# Patient Record
Sex: Female | Born: 1975 | Race: Black or African American | Hispanic: No | Marital: Single | Smoking: Never smoker
Health system: Southern US, Community
[De-identification: ages and names within clinical notes are randomized; demographics above are authoritative.]

## PROBLEM LIST (undated history)

## (undated) DIAGNOSIS — I10 Essential (primary) hypertension: Secondary | ICD-10-CM

## (undated) DIAGNOSIS — D649 Anemia, unspecified: Secondary | ICD-10-CM

## (undated) DIAGNOSIS — D219 Benign neoplasm of connective and other soft tissue, unspecified: Secondary | ICD-10-CM

## (undated) DIAGNOSIS — J302 Other seasonal allergic rhinitis: Secondary | ICD-10-CM

## (undated) DIAGNOSIS — F819 Developmental disorder of scholastic skills, unspecified: Secondary | ICD-10-CM

## (undated) HISTORY — DX: Benign neoplasm of connective and other soft tissue, unspecified: D21.9

## (undated) HISTORY — DX: Other seasonal allergic rhinitis: J30.2

## (undated) HISTORY — DX: Essential (primary) hypertension: I10

## (undated) HISTORY — DX: Anemia, unspecified: D64.9

## (undated) HISTORY — DX: Developmental disorder of scholastic skills, unspecified: F81.9

---

## 1999-09-20 ENCOUNTER — Ambulatory Visit (HOSPITAL_COMMUNITY): Admission: RE | Admit: 1999-09-20 | Discharge: 1999-09-20 | Payer: Self-pay | Admitting: Obstetrics and Gynecology

## 2005-01-09 ENCOUNTER — Ambulatory Visit: Payer: Self-pay | Admitting: Internal Medicine

## 2006-01-19 ENCOUNTER — Ambulatory Visit: Payer: Self-pay | Admitting: Internal Medicine

## 2006-02-14 ENCOUNTER — Ambulatory Visit: Payer: Self-pay | Admitting: Internal Medicine

## 2006-02-15 ENCOUNTER — Ambulatory Visit: Payer: Self-pay | Admitting: Internal Medicine

## 2006-03-12 ENCOUNTER — Ambulatory Visit: Payer: Self-pay

## 2006-03-12 ENCOUNTER — Encounter: Payer: Self-pay | Admitting: Cardiology

## 2006-08-13 ENCOUNTER — Emergency Department (HOSPITAL_COMMUNITY): Admission: EM | Admit: 2006-08-13 | Discharge: 2006-08-13 | Payer: Self-pay | Admitting: Emergency Medicine

## 2006-08-13 ENCOUNTER — Ambulatory Visit: Payer: Self-pay | Admitting: Internal Medicine

## 2007-01-02 ENCOUNTER — Ambulatory Visit: Payer: Self-pay | Admitting: Internal Medicine

## 2007-04-09 ENCOUNTER — Telehealth: Payer: Self-pay | Admitting: Internal Medicine

## 2007-04-15 ENCOUNTER — Telehealth: Payer: Self-pay | Admitting: Internal Medicine

## 2007-05-10 ENCOUNTER — Ambulatory Visit: Payer: Self-pay | Admitting: Internal Medicine

## 2007-05-10 DIAGNOSIS — D509 Iron deficiency anemia, unspecified: Secondary | ICD-10-CM | POA: Insufficient documentation

## 2007-05-10 DIAGNOSIS — F8189 Other developmental disorders of scholastic skills: Secondary | ICD-10-CM

## 2007-05-14 ENCOUNTER — Telehealth: Payer: Self-pay | Admitting: Internal Medicine

## 2007-05-28 LAB — CONVERTED CEMR LAB
Folate: >20 ng/mL
Iron: <10 ug/dL — ABNORMAL LOW (ref 42–165)
UIBC: 369 ug/dL
Vitamin B-12: 558 pg/mL (ref 211–911)

## 2007-06-14 ENCOUNTER — Ambulatory Visit: Payer: Self-pay | Admitting: Internal Medicine

## 2007-06-18 LAB — CONVERTED CEMR LAB
Basophils Absolute: 0 10*3/uL (ref 0.0–0.1)
Basophils Relative: 0 % (ref 0–1)
Eosinophils Absolute: 0.1 10*3/uL (ref 0.0–0.7)
Eosinophils Relative: 2 % (ref 0–5)
Lymphocytes Relative: 42 % (ref 12–46)
Lymphs Abs: 2.3 10*3/uL (ref 0.7–3.3)
MCHC: 29 g/dL — ABNORMAL LOW (ref 30.0–36.0)
Monocytes Relative: 9 % (ref 3–11)
Neutro Abs: 2.6 10*3/uL (ref 1.7–7.7)
Neutrophils Relative %: 47 % (ref 43–77)
RBC: 3.9 M/uL — ABNORMAL LOW (ref 4.22–5.81)
WBC: 5.6 10*3/uL (ref 4.0–10.5)

## 2007-06-21 ENCOUNTER — Ambulatory Visit: Payer: Self-pay | Admitting: Oncology

## 2007-07-15 LAB — CHCC SMEAR

## 2007-07-15 LAB — CBC WITH DIFFERENTIAL/PLATELET
BASO%: 0 % (ref 0.0–2.0)
Basophils Absolute: 0 10*3/uL (ref 0.0–0.1)
EOS%: 2.1 % (ref 0.0–7.0)
Eosinophils Absolute: 0.1 10*3/uL (ref 0.0–0.5)
HCT: 23.6 % — ABNORMAL LOW (ref 34.8–46.6)
HGB: 7.1 g/dL — ABNORMAL LOW (ref 11.6–15.9)
LYMPH%: 38.5 % (ref 14.0–48.0)
MCH: 19.2 pg — ABNORMAL LOW (ref 26.0–34.0)
MCHC: 30.2 g/dL — ABNORMAL LOW (ref 32.0–36.0)
MCV: 63.5 fL — ABNORMAL LOW (ref 81.0–101.0)
MONO#: 0.4 10*3/uL (ref 0.1–0.9)
MONO%: 7.7 % (ref 0.0–13.0)
NEUT#: 2.5 10*3/uL (ref 1.5–6.5)
NEUT%: 51.7 % (ref 39.6–76.8)
Platelets: 397 10*3/uL (ref 145–400)
RBC: 3.72 10*6/uL (ref 3.70–5.32)
RDW: 19.4 % — ABNORMAL HIGH (ref 11.3–14.5)
WBC: 4.9 10*3/uL (ref 3.9–10.0)
lymph#: 1.9 10*3/uL (ref 0.9–3.3)

## 2007-07-17 LAB — COMPREHENSIVE METABOLIC PANEL
ALT: <8 U/L (ref 0–35)
Alkaline Phosphatase: 40 U/L (ref 39–117)
CO2: 24 mEq/L (ref 19–32)
Creatinine, Ser: 0.77 mg/dL (ref 0.40–1.20)
Sodium: 141 mEq/L (ref 135–145)
Total Bilirubin: 0.4 mg/dL (ref 0.3–1.2)
Total Protein: 7.1 g/dL (ref 6.0–8.3)

## 2007-07-17 LAB — FERRITIN: Ferritin: 4 ng/mL — ABNORMAL LOW (ref 10–291)

## 2007-07-17 LAB — IRON AND TIBC
Iron: 13 ug/dL — ABNORMAL LOW (ref 42–145)
TIBC: 443 ug/dL (ref 250–470)
UIBC: 430 ug/dL

## 2007-07-17 LAB — LACTATE DEHYDROGENASE: LDH: 229 U/L (ref 94–250)

## 2007-08-06 ENCOUNTER — Encounter: Payer: Self-pay | Admitting: Internal Medicine

## 2007-08-16 ENCOUNTER — Ambulatory Visit: Payer: Self-pay | Admitting: Oncology

## 2007-08-20 ENCOUNTER — Encounter: Payer: Self-pay | Admitting: Internal Medicine

## 2007-08-20 LAB — COMPREHENSIVE METABOLIC PANEL
ALT: 10 U/L (ref 0–35)
AST: 14 U/L (ref 0–37)
Albumin: 4.3 g/dL (ref 3.5–5.2)
Alkaline Phosphatase: 46 U/L (ref 39–117)
Potassium: 4.2 mEq/L (ref 3.5–5.3)
Sodium: 142 mEq/L (ref 135–145)
Total Bilirubin: 0.4 mg/dL (ref 0.3–1.2)
Total Protein: 7.3 g/dL (ref 6.0–8.3)

## 2007-08-20 LAB — IRON AND TIBC: %SAT: 17 % — ABNORMAL LOW (ref 20–55)

## 2007-08-20 LAB — CBC WITH DIFFERENTIAL/PLATELET
BASO%: 0.9 % (ref 0.0–2.0)
LYMPH%: 32.6 % (ref 14.0–48.0)
MCHC: 29 g/dL — ABNORMAL LOW (ref 32.0–36.0)
MCV: 76.6 fL — ABNORMAL LOW (ref 81.0–101.0)
MONO%: 8.9 % (ref 0.0–13.0)
NEUT#: 2.2 10*3/uL (ref 1.5–6.5)
Platelets: 312 10*3/uL (ref 145–400)
RBC: 4.55 10*6/uL (ref 3.70–5.32)
RDW: 25 % — ABNORMAL HIGH (ref 11.3–14.5)
WBC: 4 10*3/uL (ref 3.9–10.0)

## 2007-09-11 ENCOUNTER — Telehealth (INDEPENDENT_AMBULATORY_CARE_PROVIDER_SITE_OTHER): Payer: Self-pay | Admitting: *Deleted

## 2007-09-23 ENCOUNTER — Telehealth (INDEPENDENT_AMBULATORY_CARE_PROVIDER_SITE_OTHER): Payer: Self-pay | Admitting: *Deleted

## 2007-10-03 ENCOUNTER — Telehealth (INDEPENDENT_AMBULATORY_CARE_PROVIDER_SITE_OTHER): Payer: Self-pay | Admitting: *Deleted

## 2007-10-03 ENCOUNTER — Emergency Department (HOSPITAL_COMMUNITY): Admission: EM | Admit: 2007-10-03 | Discharge: 2007-10-03 | Payer: Self-pay | Admitting: Emergency Medicine

## 2007-10-14 ENCOUNTER — Telehealth (INDEPENDENT_AMBULATORY_CARE_PROVIDER_SITE_OTHER): Payer: Self-pay | Admitting: *Deleted

## 2007-10-16 ENCOUNTER — Ambulatory Visit: Payer: Self-pay | Admitting: Oncology

## 2007-10-18 ENCOUNTER — Ambulatory Visit: Payer: Self-pay | Admitting: Internal Medicine

## 2007-10-18 DIAGNOSIS — D259 Leiomyoma of uterus, unspecified: Secondary | ICD-10-CM | POA: Insufficient documentation

## 2007-10-21 ENCOUNTER — Encounter: Payer: Self-pay | Admitting: Internal Medicine

## 2007-10-21 LAB — CBC WITH DIFFERENTIAL/PLATELET
Basophils Absolute: 0 10*3/uL (ref 0.0–0.1)
Eosinophils Absolute: 0.2 10*3/uL (ref 0.0–0.5)
HCT: 33.8 % — ABNORMAL LOW (ref 34.8–46.6)
LYMPH%: 37.2 % (ref 14.0–48.0)
MCV: 81.1 fL (ref 81.0–101.0)
MONO%: 7.3 % (ref 0.0–13.0)
RBC: 4.17 10*6/uL (ref 3.70–5.32)
WBC: 4.3 10*3/uL (ref 3.9–10.0)

## 2007-10-21 LAB — IRON AND TIBC
%SAT: 23 % (ref 20–55)
Iron: 68 ug/dL (ref 42–145)
UIBC: 226 ug/dL

## 2007-10-21 LAB — FERRITIN: Ferritin: 69 ng/mL (ref 10–291)

## 2007-10-21 LAB — COMPREHENSIVE METABOLIC PANEL
Alkaline Phosphatase: 41 U/L (ref 39–117)
BUN: 11 mg/dL (ref 6–23)
Creatinine, Ser: 1.08 mg/dL (ref 0.40–1.20)
Glucose, Bld: 92 mg/dL (ref 70–99)
Total Bilirubin: 0.3 mg/dL (ref 0.3–1.2)

## 2007-12-17 ENCOUNTER — Ambulatory Visit: Payer: Self-pay | Admitting: Oncology

## 2007-12-19 ENCOUNTER — Encounter: Payer: Self-pay | Admitting: Internal Medicine

## 2007-12-19 LAB — CBC WITH DIFFERENTIAL/PLATELET
Basophils Absolute: 0 10*3/uL (ref 0.0–0.1)
Eosinophils Absolute: 0.2 10*3/uL (ref 0.0–0.5)
HCT: 35.4 % (ref 34.8–46.6)
HGB: 11.7 g/dL (ref 11.6–15.9)
LYMPH%: 33.4 % (ref 14.0–48.0)
MCV: 79.3 fL — ABNORMAL LOW (ref 81.0–101.0)
MONO%: 8.1 % (ref 0.0–13.0)
NEUT#: 3.3 10*3/uL (ref 1.5–6.5)
NEUT%: 54.3 % (ref 39.6–76.8)
Platelets: 332 10*3/uL (ref 145–400)

## 2007-12-19 LAB — FERRITIN: Ferritin: 40 ng/mL (ref 10–291)

## 2007-12-19 LAB — COMPREHENSIVE METABOLIC PANEL
Alkaline Phosphatase: 46 U/L (ref 39–117)
BUN: 11 mg/dL (ref 6–23)
CO2: 24 mEq/L (ref 19–32)
Glucose, Bld: 96 mg/dL (ref 70–99)
Total Bilirubin: 0.3 mg/dL (ref 0.3–1.2)

## 2007-12-19 LAB — IRON AND TIBC
%SAT: 20 % (ref 20–55)
Iron: 64 ug/dL (ref 42–145)
UIBC: 255 ug/dL

## 2007-12-19 LAB — LACTATE DEHYDROGENASE: LDH: 212 U/L (ref 94–250)

## 2008-02-18 ENCOUNTER — Ambulatory Visit: Payer: Self-pay | Admitting: Oncology

## 2008-02-24 ENCOUNTER — Encounter: Payer: Self-pay | Admitting: Internal Medicine

## 2008-02-24 LAB — COMPREHENSIVE METABOLIC PANEL
Albumin: 4.3 g/dL (ref 3.5–5.2)
CO2: 23 mEq/L (ref 19–32)
Calcium: 9 mg/dL (ref 8.4–10.5)
Glucose, Bld: 85 mg/dL (ref 70–99)
Sodium: 141 mEq/L (ref 135–145)
Total Bilirubin: 0.2 mg/dL — ABNORMAL LOW (ref 0.3–1.2)
Total Protein: 7.3 g/dL (ref 6.0–8.3)

## 2008-02-24 LAB — CBC WITH DIFFERENTIAL/PLATELET
Eosinophils Absolute: 0.2 10*3/uL (ref 0.0–0.5)
HCT: 33.6 % — ABNORMAL LOW (ref 34.8–46.6)
LYMPH%: 32.1 % (ref 14.0–48.0)
MONO#: 0.4 10*3/uL (ref 0.1–0.9)
NEUT#: 2.9 10*3/uL (ref 1.5–6.5)
Platelets: 319 10*3/uL (ref 145–400)
RBC: 4.2 10*6/uL (ref 3.70–5.32)
WBC: 5.1 10*3/uL (ref 3.9–10.0)

## 2008-02-24 LAB — IRON AND TIBC
%SAT: 11 % — ABNORMAL LOW (ref 20–55)
Iron: 37 ug/dL — ABNORMAL LOW (ref 42–145)

## 2008-02-24 LAB — LACTATE DEHYDROGENASE: LDH: 236 U/L (ref 94–250)

## 2008-04-06 ENCOUNTER — Telehealth: Payer: Self-pay | Admitting: Internal Medicine

## 2008-04-08 ENCOUNTER — Ambulatory Visit: Payer: Self-pay | Admitting: Internal Medicine

## 2008-04-08 DIAGNOSIS — E669 Obesity, unspecified: Secondary | ICD-10-CM | POA: Insufficient documentation

## 2008-04-08 DIAGNOSIS — Q210 Ventricular septal defect: Secondary | ICD-10-CM

## 2008-04-22 ENCOUNTER — Ambulatory Visit: Payer: Self-pay | Admitting: Oncology

## 2008-04-24 ENCOUNTER — Encounter: Payer: Self-pay | Admitting: Internal Medicine

## 2008-04-24 LAB — CBC WITH DIFFERENTIAL/PLATELET
Basophils Absolute: 0 10*3/uL (ref 0.0–0.1)
Eosinophils Absolute: 0.1 10*3/uL (ref 0.0–0.5)
HGB: 12.1 g/dL (ref 11.6–15.9)
MCV: 82 fL (ref 81.0–101.0)
MONO#: 0.4 10*3/uL (ref 0.1–0.9)
NEUT#: 2.7 10*3/uL (ref 1.5–6.5)
RDW: 16.7 % — ABNORMAL HIGH (ref 11.3–14.5)
WBC: 5.2 10*3/uL (ref 3.9–10.0)
lymph#: 2 10*3/uL (ref 0.9–3.3)

## 2008-04-24 LAB — COMPREHENSIVE METABOLIC PANEL
Albumin: 4.4 g/dL (ref 3.5–5.2)
BUN: 12 mg/dL (ref 6–23)
CO2: 24 mEq/L (ref 19–32)
Calcium: 9.8 mg/dL (ref 8.4–10.5)
Chloride: 104 mEq/L (ref 96–112)
Glucose, Bld: 79 mg/dL (ref 70–99)
Potassium: 4.1 mEq/L (ref 3.5–5.3)
Sodium: 136 mEq/L (ref 135–145)
Total Protein: 7.4 g/dL (ref 6.0–8.3)

## 2008-04-24 LAB — FERRITIN: Ferritin: 16 ng/mL (ref 10–291)

## 2008-04-24 LAB — IRON AND TIBC: Iron: 39 ug/dL — ABNORMAL LOW (ref 42–145)

## 2008-06-18 ENCOUNTER — Ambulatory Visit: Payer: Self-pay | Admitting: Oncology

## 2008-06-22 ENCOUNTER — Encounter: Payer: Self-pay | Admitting: Internal Medicine

## 2008-06-22 LAB — CBC WITH DIFFERENTIAL/PLATELET
BASO%: 0.6 % (ref 0.0–2.0)
EOS%: 1.8 % (ref 0.0–7.0)
Eosinophils Absolute: 0.1 10*3/uL (ref 0.0–0.5)
HCT: 34.6 % — ABNORMAL LOW (ref 34.8–46.6)
HGB: 11.3 g/dL — ABNORMAL LOW (ref 11.6–15.9)
MONO#: 0.4 10*3/uL (ref 0.1–0.9)
MONO%: 9.7 % (ref 0.0–13.0)
Platelets: 352 10*3/uL (ref 145–400)
WBC: 4.6 10*3/uL (ref 3.9–10.0)
lymph#: 1.7 10*3/uL (ref 0.9–3.3)

## 2008-06-26 LAB — COMPREHENSIVE METABOLIC PANEL
ALT: 10 U/L (ref 0–35)
Albumin: 4.2 g/dL (ref 3.5–5.2)
BUN: 10 mg/dL (ref 6–23)
CO2: 26 mEq/L (ref 19–32)
Calcium: 8.8 mg/dL (ref 8.4–10.5)
Chloride: 104 mEq/L (ref 96–112)
Creatinine, Ser: 0.65 mg/dL (ref 0.40–1.20)
Potassium: 4.1 mEq/L (ref 3.5–5.3)

## 2008-06-26 LAB — VON WILLEBRAND PANEL: Ristocetin-Cofactor: 64 % (ref 50–150)

## 2008-06-26 LAB — FERRITIN: Ferritin: 15 ng/mL (ref 10–291)

## 2008-06-26 LAB — IRON AND TIBC: TIBC: 334 ug/dL (ref 250–470)

## 2008-06-26 LAB — LACTATE DEHYDROGENASE: LDH: 230 U/L (ref 94–250)

## 2008-08-18 ENCOUNTER — Ambulatory Visit: Payer: Self-pay | Admitting: Oncology

## 2008-08-24 ENCOUNTER — Encounter: Payer: Self-pay | Admitting: Internal Medicine

## 2008-08-24 LAB — CBC WITH DIFFERENTIAL/PLATELET
HGB: 10.6 g/dL — ABNORMAL LOW (ref 11.6–15.9)
LYMPH%: 30 % (ref 14.0–48.0)
MCHC: 32.4 g/dL (ref 32.0–36.0)
NEUT#: 3.2 10*3/uL (ref 1.5–6.5)
NEUT%: 59.5 % (ref 39.6–76.8)
Platelets: 269 10*3/uL (ref 145–400)
RBC: 4.08 10*6/uL (ref 3.70–5.32)
RDW: 17.8 % — ABNORMAL HIGH (ref 11.3–14.5)
WBC: 5.4 10*3/uL (ref 3.9–10.0)

## 2008-08-26 LAB — COMPREHENSIVE METABOLIC PANEL
ALT: 10 U/L (ref 0–35)
AST: 14 U/L (ref 0–37)
Albumin: 4 g/dL (ref 3.5–5.2)
BUN: 9 mg/dL (ref 6–23)
CO2: 21 mEq/L (ref 19–32)
Calcium: 8.9 mg/dL (ref 8.4–10.5)
Chloride: 108 mEq/L (ref 96–112)
Creatinine, Ser: 0.66 mg/dL (ref 0.40–1.20)
Potassium: 4.1 mEq/L (ref 3.5–5.3)

## 2008-08-26 LAB — IRON AND TIBC: UIBC: 322 ug/dL

## 2008-08-26 LAB — LACTATE DEHYDROGENASE: LDH: 218 U/L (ref 94–250)

## 2008-08-26 LAB — TRANSFERRIN RECEPTOR, SOLUABLE: Transferrin Receptor, Soluble: 42.6 nmol/L

## 2008-08-28 HISTORY — PX: MYOMECTOMY: SHX85

## 2008-10-21 ENCOUNTER — Ambulatory Visit: Payer: Self-pay | Admitting: Oncology

## 2008-10-23 ENCOUNTER — Encounter: Payer: Self-pay | Admitting: Internal Medicine

## 2008-10-23 LAB — CBC WITH DIFFERENTIAL/PLATELET
BASO%: 0.4 % (ref 0.0–2.0)
Basophils Absolute: 0 10*3/uL (ref 0.0–0.1)
HGB: 10.5 g/dL — ABNORMAL LOW (ref 11.6–15.9)
MCH: 27.2 pg (ref 25.1–34.0)
NEUT#: 2.9 10*3/uL (ref 1.5–6.5)
NEUT%: 60.1 % (ref 38.4–76.8)
Platelets: 268 10*3/uL (ref 145–400)
RBC: 3.85 10*6/uL (ref 3.70–5.45)
RDW: 18.6 % — ABNORMAL HIGH (ref 11.2–14.5)
lymph#: 1.4 10*3/uL (ref 0.9–3.3)

## 2008-10-23 LAB — COMPREHENSIVE METABOLIC PANEL
AST: 23 U/L (ref 0–37)
Alkaline Phosphatase: 39 U/L (ref 39–117)
BUN: 8 mg/dL (ref 6–23)
Creatinine, Ser: 0.73 mg/dL (ref 0.40–1.20)
Glucose, Bld: 113 mg/dL — ABNORMAL HIGH (ref 70–99)
Potassium: 4 mEq/L (ref 3.5–5.3)

## 2008-10-23 LAB — LACTATE DEHYDROGENASE: LDH: 210 U/L (ref 94–250)

## 2008-10-25 LAB — IRON AND TIBC
%SAT: 15 % — ABNORMAL LOW (ref 20–55)
Iron: 38 ug/dL — ABNORMAL LOW (ref 42–145)
TIBC: 262 ug/dL (ref 250–470)

## 2008-10-25 LAB — TRANSFERRIN RECEPTOR, SOLUABLE: Transferrin Receptor, Soluble: 39.3 nmol/L

## 2008-12-05 ENCOUNTER — Encounter: Payer: Self-pay | Admitting: Internal Medicine

## 2009-01-05 ENCOUNTER — Ambulatory Visit: Payer: Self-pay | Admitting: Oncology

## 2009-01-07 ENCOUNTER — Encounter: Payer: Self-pay | Admitting: Internal Medicine

## 2009-01-07 LAB — COMPREHENSIVE METABOLIC PANEL
Albumin: 3.7 g/dL (ref 3.5–5.2)
CO2: 26 mEq/L (ref 19–32)
Calcium: 8.9 mg/dL (ref 8.4–10.5)
Chloride: 107 mEq/L (ref 96–112)
Glucose, Bld: 114 mg/dL — ABNORMAL HIGH (ref 70–99)
Potassium: 3.7 mEq/L (ref 3.5–5.3)
Total Bilirubin: 0.5 mg/dL (ref 0.3–1.2)
Total Protein: 6.7 g/dL (ref 6.0–8.3)

## 2009-01-07 LAB — CBC WITH DIFFERENTIAL/PLATELET
HCT: 28 % — ABNORMAL LOW (ref 34.8–46.6)
LYMPH%: 32.8 % (ref 14.0–49.7)
MCH: 26.4 pg (ref 25.1–34.0)
MONO#: 0.4 10*3/uL (ref 0.1–0.9)
NEUT#: 3 10*3/uL (ref 1.5–6.5)
NEUT%: 57.5 % (ref 38.4–76.8)
Platelets: 297 10*3/uL (ref 145–400)
WBC: 5.2 10*3/uL (ref 3.9–10.3)

## 2009-01-07 LAB — LACTATE DEHYDROGENASE: LDH: 140 U/L (ref 94–250)

## 2009-01-11 LAB — IRON AND TIBC
Iron: 24 ug/dL — ABNORMAL LOW (ref 42–145)
TIBC: 303 ug/dL (ref 250–470)

## 2009-01-11 LAB — TRANSFERRIN RECEPTOR, SOLUABLE: Transferrin Receptor, Soluble: 45.9 nmol/L

## 2009-02-22 ENCOUNTER — Encounter: Payer: Self-pay | Admitting: Internal Medicine

## 2009-02-22 ENCOUNTER — Ambulatory Visit: Payer: Self-pay | Admitting: Oncology

## 2009-02-22 LAB — CBC WITH DIFFERENTIAL/PLATELET
BASO%: 0.6 % (ref 0.0–2.0)
Basophils Absolute: 0 10*3/uL (ref 0.0–0.1)
EOS%: 2.3 % (ref 0.0–7.0)
Eosinophils Absolute: 0.1 10*3/uL (ref 0.0–0.5)
HCT: 33.6 % — ABNORMAL LOW (ref 34.8–46.6)
HGB: 11.1 g/dL — ABNORMAL LOW (ref 11.6–15.9)
MCH: 26 pg (ref 25.1–34.0)
MCHC: 33 g/dL (ref 31.5–36.0)
Platelets: 305 10*3/uL (ref 145–400)
RBC: 4.26 10*6/uL (ref 3.70–5.45)
WBC: 5 10*3/uL (ref 3.9–10.3)

## 2009-02-22 LAB — FERRITIN: Ferritin: 20 ng/mL (ref 10–291)

## 2009-02-22 LAB — IRON AND TIBC: TIBC: 340 ug/dL (ref 250–470)

## 2010-06-13 ENCOUNTER — Emergency Department (HOSPITAL_BASED_OUTPATIENT_CLINIC_OR_DEPARTMENT_OTHER): Admission: EM | Admit: 2010-06-13 | Discharge: 2010-06-13 | Payer: Self-pay | Admitting: Emergency Medicine

## 2010-11-09 LAB — CBC: MCHC: 32.5 g/dL (ref 30.0–36.0)

## 2010-12-14 ENCOUNTER — Telehealth: Payer: Self-pay | Admitting: *Deleted

## 2010-12-14 NOTE — Telephone Encounter (Signed)
Spoke w/ pt aware of information says she will call to schedule office visit.

## 2010-12-14 NOTE — Telephone Encounter (Signed)
Pt left VM that she is bother with allergy now and would like to have Rx sent in to pharmacy. Pt also notes that she has not seen Dr Drue Novel in a while but he knows of her History of allergy. Left message to call office Pt last seen 2009 will need OV.will advise Pt on OTC meds to try for allergy.

## 2011-01-05 ENCOUNTER — Encounter: Payer: Self-pay | Admitting: Internal Medicine

## 2011-01-06 ENCOUNTER — Ambulatory Visit (INDEPENDENT_AMBULATORY_CARE_PROVIDER_SITE_OTHER): Payer: BC Managed Care – PPO | Admitting: Internal Medicine

## 2011-01-06 ENCOUNTER — Encounter: Payer: Self-pay | Admitting: Internal Medicine

## 2011-01-06 DIAGNOSIS — J309 Allergic rhinitis, unspecified: Secondary | ICD-10-CM | POA: Insufficient documentation

## 2011-01-06 DIAGNOSIS — R011 Cardiac murmur, unspecified: Secondary | ICD-10-CM

## 2011-01-06 DIAGNOSIS — Z0000 Encounter for general adult medical examination without abnormal findings: Secondary | ICD-10-CM

## 2011-01-06 DIAGNOSIS — D509 Iron deficiency anemia, unspecified: Secondary | ICD-10-CM

## 2011-01-06 MED ORDER — FLUTICASONE PROPIONATE 50 MCG/ACT NA SUSP: 2.0000 | Freq: Every day | NASAL | Status: DC

## 2011-01-06 NOTE — Assessment & Plan Note (Signed)
since childhood per pt 2007  ECHO @ [facility]: essentially normal

## 2011-01-06 NOTE — Patient Instructions (Signed)
Came back fasting last week for labs: BMP FLP TSH AST ALT----dx V70 CBC iron ferritin B12 Folic acid----dx anemia

## 2011-01-06 NOTE — Assessment & Plan Note (Signed)
UTD on TD Labs Diet, exercise discussed  Needs to see gyn RTC once a year

## 2011-01-06 NOTE — Assessment & Plan Note (Signed)
Continue with over-the-counter Allegra and add Flonase. Will call if not better

## 2011-01-06 NOTE — Progress Notes (Signed)
  Subjective:    Patient ID: Robin Ellison, female    DOB: 1975-11-24, 35 y.o.   MRN: [id number]  HPI Complete physical exam No complains except for allergies in the last few days. Allegra is helping some.  Past Medical History  Diagnosis Date  . Fibroids     s/p removal ~ 2010  . Learning disability   . Anemia     iron def, used to see hematology for IV iron   No past surgical history on file.  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 Unexpected weight change: has gained wt despite "not eating much"  Respiratory: Negative for cough and shortness of breath.   Gastrointestinal: Negative for abdominal pain and blood in stool.  Genitourinary: Negative for dysuria and hematuria.       Since her surgery, she still have monthly periods, they are definitely less intense than before.  Psychiatric/Behavioral:       No anxiety, depression       Objective:   Physical Exam  Constitutional: She is oriented to person, place, and time. She appears well-developed.       Overweight appearing  HENT:  Head: Normocephalic and atraumatic.  Eyes: No scleral icterus.       Slightly pale  Neck: No thyromegaly present.  Cardiovascular: Normal rate, regular rhythm and normal heart sounds.   No murmur heard. Pulmonary/Chest: Effort normal and breath sounds normal. No respiratory distress. She has no wheezes. She has no rales.  Abdominal: Soft. Bowel sounds are normal. She exhibits no distension. There is no tenderness. There is no rebound and no guarding.  Musculoskeletal: She exhibits no edema.  Neurological: She is alert and oriented to person, place, and time.  Psychiatric: She has a normal mood and affect. Her  behavior is normal. Thought content normal.          Assessment & Plan:

## 2011-01-06 NOTE — Assessment & Plan Note (Signed)
H/o severe anemia due to DUB, at some point needed IV iron, better since DUB corrected. Last OV to hematology 2010. Labs

## 2011-04-11 ENCOUNTER — Telehealth: Payer: Self-pay

## 2011-04-11 NOTE — Telephone Encounter (Signed)
Patient called triage line: started taking hair,skin, and nail supplement 1 weeks ago and questions if it is making her headaches (that she has had for years) worse. I offered patient an appointment today and she stated there is no way she could come in today. I then offered patient an appointment for tomorrow and patient stated she would have to call us later when she can decided on an appointment date and time suitable for her. Patient was informed that when she comes in to bring all pill/supplement bottles , agreed.

## 2011-05-19 LAB — DIFFERENTIAL
Eosinophils Absolute: 0
Lymphs Abs: 0.7
Monocytes Absolute: 0.7
Neutrophils Relative %: 85 — ABNORMAL HIGH

## 2011-05-19 LAB — CBC
HCT: 35.4 — ABNORMAL LOW
Hemoglobin: 11.4 — ABNORMAL LOW
MCHC: 32.1
MCV: 79.8
RDW: 23.2 — ABNORMAL HIGH

## 2011-05-19 LAB — BASIC METABOLIC PANEL
CO2: 26
Chloride: 101
Glucose, Bld: 102 — ABNORMAL HIGH
Potassium: 3.7
Sodium: 133 — ABNORMAL LOW

## 2011-05-19 LAB — GC/CHLAMYDIA PROBE AMP, GENITAL: GC Probe Amp, Genital: NEGATIVE

## 2011-05-19 LAB — URINALYSIS, ROUTINE W REFLEX MICROSCOPIC
Glucose, UA: NEGATIVE
Hgb urine dipstick: NEGATIVE
Ketones, ur: NEGATIVE
Protein, ur: 30 — AB
Urobilinogen, UA: 0.2

## 2011-05-19 LAB — WET PREP, GENITAL
Trich, Wet Prep: NONE SEEN
Yeast Wet Prep HPF POC: NONE SEEN

## 2011-07-17 ENCOUNTER — Telehealth: Payer: Self-pay | Admitting: *Deleted

## 2011-07-17 ENCOUNTER — Encounter (HOSPITAL_COMMUNITY): Payer: Self-pay | Admitting: *Deleted

## 2011-07-17 ENCOUNTER — Emergency Department (HOSPITAL_COMMUNITY)
Admission: EM | Admit: 2011-07-17 | Discharge: 2011-07-17 | Discharge status: Against Medical Advice | Payer: BC Managed Care – PPO | Attending: Emergency Medicine | Admitting: Emergency Medicine

## 2011-07-17 DIAGNOSIS — R531 Weakness: Secondary | ICD-10-CM

## 2011-07-17 DIAGNOSIS — R5383 Other fatigue: Secondary | ICD-10-CM | POA: Insufficient documentation

## 2011-07-17 DIAGNOSIS — R51 Headache: Secondary | ICD-10-CM | POA: Insufficient documentation

## 2011-07-17 DIAGNOSIS — R5381 Other malaise: Secondary | ICD-10-CM | POA: Insufficient documentation

## 2011-07-17 NOTE — Telephone Encounter (Signed)
Pt left VM that she would like know what she can do about iron level beside take iron tablet or get IV infusion..Left message to call office

## 2011-07-18 NOTE — ED Provider Notes (Signed)
The Patient left without being seen by a provider after triage  Robin Co, MD 07/18/11 [phone]

## 2011-07-19 NOTE — Telephone Encounter (Signed)
Pt return call stating that she was trying to get OV to come in today since she was off work, but since Dr Drue Novel is out of the office she will call later to schedule OV when she is available.

## 2013-03-21 ENCOUNTER — Encounter: Payer: Self-pay | Admitting: *Deleted

## 2013-03-21 ENCOUNTER — Telehealth: Payer: Self-pay

## 2013-03-21 ENCOUNTER — Other Ambulatory Visit: Payer: BC Managed Care – PPO

## 2013-03-21 ENCOUNTER — Telehealth: Payer: Self-pay | Admitting: *Deleted

## 2013-03-21 ENCOUNTER — Encounter: Payer: Self-pay | Admitting: Internal Medicine

## 2013-03-21 ENCOUNTER — Ambulatory Visit (INDEPENDENT_AMBULATORY_CARE_PROVIDER_SITE_OTHER): Payer: BC Managed Care – PPO | Admitting: Internal Medicine

## 2013-03-21 VITALS — BP 165/100 | HR 99 | Temp 98.3°F | Wt 214.2 lb

## 2013-03-21 DIAGNOSIS — I10 Essential (primary) hypertension: Secondary | ICD-10-CM | POA: Insufficient documentation

## 2013-03-21 DIAGNOSIS — D509 Iron deficiency anemia, unspecified: Secondary | ICD-10-CM

## 2013-03-21 DIAGNOSIS — R011 Cardiac murmur, unspecified: Secondary | ICD-10-CM

## 2013-03-21 DIAGNOSIS — D649 Anemia, unspecified: Secondary | ICD-10-CM

## 2013-03-21 HISTORY — DX: Essential (primary) hypertension: I10

## 2013-03-21 LAB — CBC WITH DIFFERENTIAL/PLATELET
Basophils Relative: 0.5 % (ref 0.0–3.0)
HCT: 33.6 % — ABNORMAL LOW (ref 36.0–46.0)
Hemoglobin: 10.6 g/dL — ABNORMAL LOW (ref 12.0–15.0)
Lymphocytes Relative: 24.8 % (ref 12.0–46.0)
Lymphs Abs: 1.6 10*3/uL (ref 0.7–4.0)
MCHC: 31.5 g/dL (ref 30.0–36.0)
Monocytes Relative: 5.4 % (ref 3.0–12.0)
Neutro Abs: 4.2 10*3/uL (ref 1.4–7.7)
RBC: 4.4 Mil/uL (ref 3.87–5.11)
RDW: 20.6 % — ABNORMAL HIGH (ref 11.5–14.6)

## 2013-03-21 LAB — BASIC METABOLIC PANEL
BUN: 10 mg/dL (ref 6–23)
Chloride: 101 mEq/L (ref 96–112)
GFR: 106.23 mL/min (ref >60.00)
Glucose, Bld: 139 mg/dL — ABNORMAL HIGH (ref 70–99)
Potassium: 3.7 mEq/L (ref 3.5–5.1)
Sodium: 137 mEq/L (ref 135–145)

## 2013-03-21 LAB — TSH: TSH: 1.05 u[IU]/mL (ref 0.35–5.50)

## 2013-03-21 MED ORDER — METOPROLOL TARTRATE 25 MG PO TABS: 25.0000 mg | ORAL_TABLET | Freq: Two times a day (BID) | ORAL | Status: DC

## 2013-03-21 NOTE — Telephone Encounter (Signed)
Patient walked in and informed receptionist she needed labs drawn. This triage nurse reviewed patient files from previous visits and has not been seen by any of our [facility] physicians since 02/22/09, at which time she was seen by Dr Arline Asp. Since that time, Dr Arline Asp has retired and patient currently does not have an attending MD here. Noted that patient was seen earlier today by Dr Drue Novel and labs were ordered but not drawn. Called and spoke with Dr Leta Jungling receptionist, she confirmed patient left there and did not have labs drawn, that she needs to return to have this done. Also informed her that if patient is in need of Hematology/Anemia consult, patient will need New Patient Referral to be sent to be re-established with a new attending MD. Instructed patient we are not an infusion center and she must be managed by an MD at our practice. Informed her that if she is in immediate need of transfusion of blood or iron, it would need to be arranged as an outpatient at [facility] short stay, as out next new patient appts are approx 4 weeks out. Will notify Dr Drue Novel office of this encounter with patient from this office.

## 2013-03-21 NOTE — Assessment & Plan Note (Addendum)
The last time I saw him 2012 I did not appreciate a murmur, she does have a murmur today. Will check an echocardiogram If she has severe anemia, today's murmur may be related to anemia rather than structural valvular disease. Needs clear for her dentist appointment, will decide after echocardiogram

## 2013-03-21 NOTE — Assessment & Plan Note (Signed)
Has not been checked in years, GI review of systems negative,periods normal. Plan: Labs

## 2013-03-21 NOTE — Progress Notes (Signed)
  Subjective:    Patient ID: Robin Ellison, female    DOB: 1976/05/28, 37 y.o.   MRN: [id number]  HPI Last visit about 3 years ago, here to discuss the following issues Murmur, went to see a new dentist, she requested essentially clearence from the medical doctor to proceed with dental work. History of anemia, no labs in more than 2 years. Hypertension, BP noted to be elevated today, I asked the patient about ambulatory BPs, she checks her BP " now and then" and her readings according to the patient has been Consistent with today's BP.  Past Medical History  Diagnosis Date  . Fibroids     s/p removal ~ 2010  . Learning disability   . Anemia     iron def, used to see hematology for IV iron  . Hypertension 03/21/2013   Past Surgical History  Procedure Laterality Date  . 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 Denies abnormal vaginal bleeding, periods are now monthly and not heavy. Denies nosebleed, occasional headaches for long time. Headaches are baseline. Not on birth control, she is not sexually active.     Objective:   Physical Exam BP 165/100  Pulse 99  Temp(Src) 98.3 F (36.8 C) (Oral)  Wt 214 lb 3.2 oz (97.16 kg)  BMI 39.82 kg/m2  SpO2 99%  General -- alert, well-developed, NAD  Neck --no thyromegaly  HEENT -- slt pale? Lungs -- normal respiratory effort, no intercostal retractions, no accessory muscle use, and normal breath sounds.   Heart--  II/VI Systolic murmur, most noticeable left from the sternum. Increase second heart sound?Marland Kitchen   Abdomen--soft, non-tender, no distention    Extremities-- no pretibial edema bilaterally  Neurologic-- alert & oriented X3 and strength normal in all  extremities. Psych-- Cognition and judgment appear intact. Alert and cooperative with normal attention span and concentration.  not anxious appearing and not depressed appearing.          Assessment & Plan:

## 2013-03-21 NOTE — Patient Instructions (Addendum)
Your blood pressure is elevated today, is very important that you take your blood pressure medication as directed to prevent complications such as heart attacks or strokes in the future. Is also important you come back regularly for check ups Check the  blood pressure 2 or 3 times a week, be sure it is between 110/60 and 140/85. If it is consistently higher or lower, let me know Schedule a routine checkup 2 or 3 months from today

## 2013-03-21 NOTE — Telephone Encounter (Signed)
Patient approached the front desk after her visit and was crying/upset due to the fact that her paperwork could not be signed today. She was taken from the lobby to the hallway on side B.  Advised patient that she would need the lab work and/or Echo before the doctor could sign the forms. Patient became increasingly upset. Continued to explain to patient that the doctor could not jeopardize her health and his license when he had no information to go on. I apologized to patient and continued to reiterate the same information over. She walked saying that sorry doesn't help her.   Patient called approximately 11am to apologize for her actions and to not kick her out of the practice.   Advised Dr. Drue Novel encounter.

## 2013-03-21 NOTE — Assessment & Plan Note (Addendum)
DBP back in 2012 was elevated, BP today definitely elevated. Apparently ambulatory BPs have been also high. Plan:  Start metoprolol, low dose, titrate up if needed, see instructions BMP, TSH Followup in 2 months

## 2013-03-23 ENCOUNTER — Encounter: Payer: Self-pay | Admitting: Internal Medicine

## 2013-03-25 ENCOUNTER — Telehealth: Payer: Self-pay | Admitting: Internal Medicine

## 2013-03-25 NOTE — Telephone Encounter (Signed)
Pt called about information on her echocardio referral. Please call pt back when you get a chance. thanks

## 2013-03-25 NOTE — Telephone Encounter (Signed)
I have informed patient of her appointment.

## 2013-03-27 ENCOUNTER — Ambulatory Visit (HOSPITAL_COMMUNITY): Payer: BC Managed Care – PPO | Attending: Cardiology

## 2013-03-27 DIAGNOSIS — I379 Nonrheumatic pulmonary valve disorder, unspecified: Secondary | ICD-10-CM | POA: Insufficient documentation

## 2013-03-27 DIAGNOSIS — I10 Essential (primary) hypertension: Secondary | ICD-10-CM | POA: Insufficient documentation

## 2013-03-27 DIAGNOSIS — I059 Rheumatic mitral valve disease, unspecified: Secondary | ICD-10-CM | POA: Insufficient documentation

## 2013-03-27 DIAGNOSIS — Q210 Ventricular septal defect: Secondary | ICD-10-CM | POA: Insufficient documentation

## 2013-03-27 DIAGNOSIS — I079 Rheumatic tricuspid valve disease, unspecified: Secondary | ICD-10-CM | POA: Insufficient documentation

## 2013-03-27 DIAGNOSIS — R011 Cardiac murmur, unspecified: Secondary | ICD-10-CM | POA: Insufficient documentation

## 2013-03-27 NOTE — Progress Notes (Signed)
Echocardiogram performed.  

## 2013-05-11 ENCOUNTER — Encounter (HOSPITAL_COMMUNITY): Payer: Self-pay | Admitting: Emergency Medicine

## 2013-05-11 ENCOUNTER — Emergency Department (HOSPITAL_COMMUNITY)
Admission: EM | Admit: 2013-05-11 | Discharge: 2013-05-11 | Disposition: A | Discharge status: Home / Self Care | Payer: BC Managed Care – PPO | Source: Home / Self Care

## 2013-05-11 DIAGNOSIS — J020 Streptococcal pharyngitis: Secondary | ICD-10-CM

## 2013-05-11 LAB — POCT RAPID STREP A: Streptococcus, Group A Screen (Direct): POSITIVE — AB

## 2013-05-11 MED ORDER — PENICILLIN G BENZATHINE 1200000 UNIT/2ML IM SUSP
INTRAMUSCULAR | Status: AC
  Filled 2013-05-11: qty 2

## 2013-05-11 MED ORDER — AMOXICILLIN 500 MG PO CAPS: 500.0000 mg | ORAL_CAPSULE | Freq: Three times a day (TID) | ORAL | Status: DC

## 2013-05-11 MED ORDER — PENICILLIN G BENZATHINE 1200000 UNIT/2ML IM SUSP
1.2000 10*6.[IU] | Freq: Once | INTRAMUSCULAR | Status: AC
  Administered 2013-05-11: 1.2 10*6.[IU] via INTRAMUSCULAR

## 2013-05-11 NOTE — ED Provider Notes (Signed)
CSN: [id number]     Arrival date & time 05/11/13  1533 History   First MD Initiated Contact with Patient 05/11/13 1610     Chief Complaint  Patient presents with  . Sore Throat   (Consider location/radiation/quality/duration/timing/severity/associated sxs/prior Treatment) HPI Comments: The 37 year old female presents with a sore throat. She states it is so painful she is unable to open her mouth and spit. She denies shortness of breath or problems with the airway. She is able to swallow as well as drink small amounts of liquids. She is fully awake and alert with normal respirations in no acute distress. Denies earache.   Past Medical History  Diagnosis Date  . Fibroids     s/p removal ~ 2010  . Learning disability   . Anemia     iron def, used to see hematology for IV iron  . Hypertension 03/21/2013   Past Surgical History  Procedure Laterality Date  . No past surgeries     Family History  Problem Relation Age of Onset  . Kidney disease Father     End sate  . Coronary artery disease Father   . Diabetes      grandmother   History  Substance Use Topics  . Smoking status: Never Smoker   . Smokeless tobacco: Not on file  . Alcohol Use: Yes     Comment: rare   OB History   Grav Para Term Preterm Abortions TAB SAB Ect Mult Living                 Review of Systems  Constitutional: Positive for fever and appetite change.  HENT: Positive for sore throat and voice change. Negative for ear pain, rhinorrhea, neck pain, postnasal drip and sinus pressure.   Respiratory: Negative.   Cardiovascular: Negative.   Gastrointestinal: Negative.   Genitourinary: Negative.   Skin: Negative for rash.  Neurological: Negative.     Allergies  Review of patient's allergies indicates no known allergies.  Home Medications   Current Outpatient Rx  Name  Route  Sig  Dispense  Refill  . amoxicillin (AMOXIL) 500 MG capsule   Oral   Take 1 capsule (500 mg total) by mouth 3 (three) times  daily. Start 05/11/13   21 capsule   0   . EXPIRED: fluticasone (FLONASE) 50 MCG/ACT nasal spray   Nasal   2 sprays by Nasal route daily.   16 g   2   . metoprolol tartrate (LOPRESSOR) 25 MG tablet   Oral   Take 1 tablet (25 mg total) by mouth 2 (two) times daily.   60 tablet   3    BP 136/92  Pulse 90  Temp(Src) 100.3 F (37.9 C) (Oral)  Resp 18  SpO2 100%  LMP 05/08/2013 Physical Exam  Nursing note and vitals reviewed. Constitutional: She is oriented to person, place, and time. She appears well-developed and well-nourished. No distress.  HENT:  Bilateral TMs are normal Oropharynx difficult to see due to patient's tongue retraction and body habitus with redundant oropharyngeal tissue. There is erythema but no exudates seen. No stridor.  Eyes: Conjunctivae are normal.  Neck: Normal range of motion. Neck supple.  Cardiovascular: Normal rate, regular rhythm and normal heart sounds.   Pulmonary/Chest: Effort normal and breath sounds normal. No respiratory distress.  Lymphadenopathy:    She has no cervical adenopathy.  Neurological: She is alert and oriented to person, place, and time. She exhibits normal muscle tone.  Skin: Skin is warm and  dry. No rash noted.  Psychiatric: She has a normal mood and affect.    ED Course  Procedures (including critical care time) Labs Review Labs Reviewed  POCT RAPID STREP A ([facility]RG CARE ONLY) - Abnormal; Notable for the following:    Streptococcus, Group A Screen (Direct) POSITIVE (*)    All other components within normal limits   Imaging Review No results found.  MDM   1. Strep pharyngitis      Bicillin LA 1.2 million units IM Tomorrow start amoxicillin 500 mg 3 times a day for 7 days Be sure to drink plenty of clear liquids especially water. Mechanical soft foods gradually as tolerated.    Hayden Rasmussen, NP 05/11/13 [phone]

## 2013-05-11 NOTE — ED Notes (Signed)
C/o strep throat.  Patient can barely talk

## 2013-05-12 NOTE — ED Provider Notes (Signed)
Medical screening examination/treatment/procedure(s) were performed by a resident physician or non-physician practitioner and as the supervising physician I was immediately available for consultation/collaboration.  Clementeen Graham, MD   Rodolph Bong, MD 05/12/13 [phone]

## 2013-06-05 ENCOUNTER — Telehealth: Payer: Self-pay | Admitting: *Deleted

## 2013-06-05 MED ORDER — NYSTATIN-TRIAMCINOLONE 100000-0.1 UNIT/GM-% EX OINT: TOPICAL_OINTMENT | Freq: Two times a day (BID) | CUTANEOUS | Status: DC

## 2013-06-05 NOTE — Telephone Encounter (Signed)
Patient called and stated that she has a rash under both breasts that comes and goes. She states she has had the problem for over ten years. She states that the rash itches and now has open sores due to her scratching the area. Please advise.

## 2013-06-05 NOTE — Telephone Encounter (Signed)
She needs to be seen, in the meantime she can use a prescription for a cream I just sent to her pharmacy. Please arrange a routine visit at the patient's earliest convenience.

## 2013-06-06 ENCOUNTER — Telehealth: Payer: Self-pay | Admitting: Internal Medicine

## 2013-06-06 NOTE — Telephone Encounter (Signed)
FYI-Patient would like Dr. Drue Novel to know that she has been taking her bp medication and her readings have been very good. She states the highest it has been is 130/90.

## 2013-06-06 NOTE — Telephone Encounter (Signed)
lmovm \. DJR

## 2013-06-09 NOTE — Telephone Encounter (Signed)
Noted, thx.

## 2013-06-11 NOTE — Telephone Encounter (Signed)
lmovm for the third time. DJR

## 2013-06-12 ENCOUNTER — Other Ambulatory Visit: Payer: Self-pay | Admitting: Internal Medicine

## 2013-06-12 NOTE — Telephone Encounter (Signed)
rx refilled per protocol. DJR  

## 2013-10-07 ENCOUNTER — Other Ambulatory Visit: Payer: Self-pay | Admitting: Internal Medicine

## 2013-10-10 ENCOUNTER — Other Ambulatory Visit: Payer: Self-pay | Admitting: Internal Medicine

## 2013-12-10 ENCOUNTER — Telehealth: Payer: Self-pay | Admitting: Internal Medicine

## 2013-12-10 NOTE — Telephone Encounter (Signed)
Patient called stating that she has allergies and wanted to see what kind of meds can she take OTC while taking her blood pressure meds. Please advise

## 2013-12-11 NOTE — Telephone Encounter (Signed)
Claritin OTC 10 mg one tablet daily prn Continue with Flonase 2 sprays in each side of the nose daily, Flonase is now OTC

## 2013-12-11 NOTE — Telephone Encounter (Signed)
LMOVM

## 2014-02-14 ENCOUNTER — Other Ambulatory Visit: Payer: Self-pay | Admitting: Internal Medicine

## 2014-03-10 ENCOUNTER — Telehealth: Payer: Self-pay | Admitting: Internal Medicine

## 2014-03-10 NOTE — Telephone Encounter (Signed)
Patient sent BP records, all within   the 120s/70 range. Advise patient, great BP control, due for a office visit, last visit a year ago

## 2014-03-11 NOTE — Telephone Encounter (Signed)
Lmovm

## 2014-03-12 NOTE — Telephone Encounter (Signed)
Patient called and stated that she was going to call back around November to schedule an appt.

## 2014-03-18 ENCOUNTER — Other Ambulatory Visit: Payer: Self-pay | Admitting: Internal Medicine

## 2014-04-17 ENCOUNTER — Other Ambulatory Visit: Payer: Self-pay | Admitting: Internal Medicine

## 2014-05-29 ENCOUNTER — Other Ambulatory Visit: Payer: Self-pay

## 2014-05-29 MED ORDER — METOPROLOL TARTRATE 25 MG PO TABS: ORAL_TABLET | ORAL | Status: DC

## 2014-06-01 ENCOUNTER — Other Ambulatory Visit: Payer: Self-pay

## 2014-06-27 ENCOUNTER — Other Ambulatory Visit: Payer: Self-pay | Admitting: Internal Medicine

## 2014-07-27 ENCOUNTER — Telehealth: Payer: Self-pay | Admitting: Internal Medicine

## 2014-07-27 NOTE — Telephone Encounter (Signed)
Left a message for call back.  

## 2014-07-27 NOTE — Telephone Encounter (Signed)
Patient Information:  Caller Name: Jonelle Sidle  Phone: [phone]  Patient: Robin Ellison, Robin Ellison  Gender: Female  DOB: November 28, 1975  Age: 38 Years  PCP: Kathlene November  Pregnant: No  Office Follow Up:  Does the office need to follow up with this patient?: Yes  Instructions For The Office: Call for possible workin appointment for 07/27/14. If unable to reach on cell number please call [phone] at the [facility]   Symptoms  Reason For Call & Symptoms: Received call from [place] [facility] and states they have no appointments availlable. [employer] states she fainted on 07/23/14. States she was unresponsive x 20 seconds. Has had no symptoms since 07/23/14. Per fainting protocol has see provider today dispositiion due to patient wants to be seen. No appts available in EPIC . Please call Bennie at  [facility] [phone] from [phone] to [phone] or  call cell [phone] for possible work in appointment on 07/27/14. Care advice given.  Reviewed Health History In EMR: Yes  Reviewed Medications In EMR: Yes  Reviewed Allergies In EMR: Yes  Reviewed Surgeries / Procedures: Yes  Date of Onset of Symptoms: 07/23/2014 OB / GYN:  LMP: 07/20/2014  Guideline(s) Used:  Fainting  Disposition Per Guideline:   See Today in Office  Reason For Disposition Reached:   Patient wants to be seen  Advice Given:  Treatment:  Lie down with feet elevated for 10 minutes (Reason: simple fainting is due to temporarily decreased blood flow to the brain).  Drink some fruit juice, especially if you have missed a meal or have not eaten in over 6 hours.  Expected Course  : Most adults with a simple faint are back to normal after lying down for 10 minutes.  Warning Symptoms for Fainting:  Fainting usually has early warning symptoms (e.g., dizziness, blurred vision, nausea, feeling cold, or warm).  If you feel these warning symptoms, immediately lie down to prevent  falling down. You only have 5 seconds to act (Reason: almost impossible to faint when lying down).  Lying down (even on the floor) is less embarrassing than fainting, no matter where you are.  Sitting down (with head between knees) is certainly better than staying standing up but is less effective than lying down.  Call Back If:  You pass out again on the same day  You are pregnant  You become worse.  Patient Will Follow Care Advice:  YES

## 2014-07-27 NOTE — Telephone Encounter (Signed)
Please advise, as Dr. Larose Kells is not in office this week.

## 2014-07-28 NOTE — Telephone Encounter (Signed)
Lum Babe returning your call

## 2014-07-29 NOTE — Telephone Encounter (Signed)
Left a message for call back. Pt may need an appointment.  Please offer an appointment when patient calls back.

## 2014-08-01 ENCOUNTER — Other Ambulatory Visit: Payer: Self-pay | Admitting: Internal Medicine

## 2014-08-05 ENCOUNTER — Telehealth: Payer: Self-pay | Admitting: Internal Medicine

## 2014-08-05 NOTE — Telephone Encounter (Addendum)
Pt scheduled follow up appointment for 09/21/13 and requesting a refill of metoprolol tartrate (LOPRESSOR) 25 MG tablet to hold her over. Please advise

## 2014-08-06 ENCOUNTER — Other Ambulatory Visit: Payer: Self-pay

## 2014-08-06 MED ORDER — METOPROLOL TARTRATE 25 MG PO TABS: ORAL_TABLET | ORAL | Status: DC

## 2014-08-06 NOTE — Telephone Encounter (Signed)
Sent to [facility] pharmacy

## 2014-09-21 ENCOUNTER — Ambulatory Visit: Payer: BC Managed Care – PPO | Admitting: Internal Medicine

## 2014-10-18 ENCOUNTER — Other Ambulatory Visit: Payer: Self-pay | Admitting: Internal Medicine

## 2014-10-25 ENCOUNTER — Telehealth: Payer: Self-pay | Admitting: Internal Medicine

## 2014-10-26 NOTE — Telephone Encounter (Signed)
I have sent a 30 day supply to [facility]. Pt will need to be seen within the month for any further refills. Please make sure she makes an appt.

## 2014-10-26 NOTE — Telephone Encounter (Signed)
METROPROLOL TARTRATE 25 MG.  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   COULD SHE HAVE A REFILL TO [facility] [place]

## 2014-12-04 ENCOUNTER — Encounter: Payer: Self-pay | Admitting: Physician Assistant

## 2014-12-04 ENCOUNTER — Ambulatory Visit (INDEPENDENT_AMBULATORY_CARE_PROVIDER_SITE_OTHER): Payer: BC Managed Care – PPO | Admitting: Physician Assistant

## 2014-12-04 VITALS — BP 124/87 | HR 80 | Temp 98.4°F | Resp 16 | Ht 61.5 in | Wt 226.5 lb

## 2014-12-04 DIAGNOSIS — I10 Essential (primary) hypertension: Secondary | ICD-10-CM | POA: Diagnosis not present

## 2014-12-04 MED ORDER — METOPROLOL TARTRATE 25 MG PO TABS: 25.0000 mg | ORAL_TABLET | Freq: Two times a day (BID) | ORAL | Status: DC

## 2014-12-04 NOTE — Assessment & Plan Note (Signed)
Stable. Blood pressure is normotensive. We'll save copy of blood pressure log to scan into the EMR. Medications refilled in PCPs absence. Encouraged patient to schedule CPE with her PCP.

## 2014-12-04 NOTE — Progress Notes (Signed)
Pre visit review using our clinic review tool, if applicable. No additional management support is needed unless otherwise documented below in the visit note/SLS  

## 2014-12-04 NOTE — Patient Instructions (Signed)
Your blood pressure and exam looks good. I have given a copy of your blood pressure Journal to Dr. Larose Kells' nurse. Please continue medications as directed.  Schedule a CPE with Dr. Larose Kells.

## 2014-12-04 NOTE — Progress Notes (Signed)
    Patient presents to clinic today for medication management. Patient is currently prescribed metoprolol 25 mg twice daily for her hypertension. Has been stable on current regimen for quite some time. Patient has a blood pressure log with her. Patient denies chest pain, palpitations, lightheadedness, dizziness, vision changes or frequent headaches.  Past Medical History  Diagnosis Date  . Fibroids     s/p removal ~ 2010  . Learning disability   . Anemia     iron def, used to see hematology for IV iron  . Hypertension 03/21/2013  . Seasonal allergies     Current Outpatient Prescriptions on File Prior to Visit  Medication Sig Dispense Refill  . fluticasone (FLONASE) 50 MCG/ACT nasal spray 2 sprays by Nasal route daily. (Patient taking differently: Place 2 sprays into the nose daily as needed. ) 16 g 2   No current facility-administered medications on file prior to visit.    No Known Allergies  Family History  Problem Relation Age of Onset  . Kidney disease Father     End sate  . Coronary artery disease Father   . Diabetes      grandmother  . Heart disease Paternal Aunt   . Cancer Paternal Aunt   . Diabetes Maternal Uncle   . Healthy Mother     History   Social History  . Marital Status: Single    Spouse Name: N/A  . Number of Children: N/A  . Years of Education: N/A   Occupational History  . 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 Smoking status: Never Smoker   . Smokeless tobacco: Not on file  . Alcohol Use: Yes     Comment: rare  . Drug Use: No  . Sexual Activity: Not on file   Other Topics Concern  . None   Social History Narrative   Review of Systems - See HPI.  All other ROS are negative.  BP 124/87 mmHg  Pulse 80  Temp(Src) 98.4 F (36.9 C) (Oral)  Resp 16  Ht 5' 1.5" (1.562 m)  Wt 226 lb 8 oz (102.74 kg)  BMI 42.11 kg/m2  SpO2 99%  LMP 12/04/2014  Physical Exam  Constitutional: She is  well-developed, well-nourished, and in no distress.  HENT:  Head: Normocephalic and atraumatic.  Eyes: Conjunctivae are normal.  Cardiovascular: Normal rate, regular rhythm, normal heart sounds and intact distal pulses.   Pulmonary/Chest: Effort normal and breath sounds normal. No respiratory distress. She has no wheezes. She has no rales. She exhibits no tenderness.  Neurological: She is alert.  Skin: Skin is warm and dry. No rash noted.  Psychiatric: Affect normal.  Vitals reviewed.  Assessment/Plan: Hypertension Stable. Blood pressure is normotensive. We'll save copy of blood pressure log to scan into the EMR. Medications refilled in PCPs absence. Encouraged patient to schedule CPE with her PCP.

## 2015-03-16 ENCOUNTER — Other Ambulatory Visit: Payer: Self-pay | Admitting: Physician Assistant

## 2015-03-16 NOTE — Telephone Encounter (Signed)
Filled previously in PCP absence. Will defer refill to him.

## 2015-04-13 ENCOUNTER — Other Ambulatory Visit: Payer: Self-pay | Admitting: Internal Medicine

## 2015-04-19 ENCOUNTER — Ambulatory Visit: Payer: BC Managed Care – PPO | Admitting: Internal Medicine

## 2015-06-01 ENCOUNTER — Telehealth: Payer: Self-pay | Admitting: Internal Medicine

## 2015-06-01 NOTE — Telephone Encounter (Signed)
Called to follow up.  Left a message for call back.   

## 2015-06-01 NOTE — Telephone Encounter (Signed)
Pt returned call.  Missed call due to being on phone with another patient.  Called pt back and left a message for call back.

## 2015-06-01 NOTE — Telephone Encounter (Signed)
Patient Name: Robin Ellison DOB: 1975-09-03 Initial Comment Caller states she is having sinus issues and has a history of high BP. Nurse Assessment Guidelines Guideline Title Affirmed Question Affirmed Notes Final Disposition User FINAL ATTEMPT MADE - no message left [place], Therapist, sports, Kermit Balo

## 2015-06-02 ENCOUNTER — Encounter: Payer: Self-pay | Admitting: Internal Medicine

## 2015-06-02 ENCOUNTER — Ambulatory Visit (INDEPENDENT_AMBULATORY_CARE_PROVIDER_SITE_OTHER): Payer: BC Managed Care – PPO | Admitting: Internal Medicine

## 2015-06-02 VITALS — BP 126/78 | HR 72 | Temp 98.3°F | Ht 62.0 in | Wt 234.0 lb

## 2015-06-02 DIAGNOSIS — Q210 Ventricular septal defect: Secondary | ICD-10-CM | POA: Diagnosis not present

## 2015-06-02 DIAGNOSIS — I10 Essential (primary) hypertension: Secondary | ICD-10-CM | POA: Diagnosis not present

## 2015-06-02 DIAGNOSIS — R0683 Snoring: Secondary | ICD-10-CM

## 2015-06-02 DIAGNOSIS — Z114 Encounter for screening for human immunodeficiency virus [HIV]: Secondary | ICD-10-CM

## 2015-06-02 DIAGNOSIS — Z09 Encounter for follow-up examination after completed treatment for conditions other than malignant neoplasm: Secondary | ICD-10-CM

## 2015-06-02 DIAGNOSIS — Z23 Encounter for immunization: Secondary | ICD-10-CM | POA: Diagnosis not present

## 2015-06-02 DIAGNOSIS — Z01419 Encounter for gynecological examination (general) (routine) without abnormal findings: Secondary | ICD-10-CM

## 2015-06-02 MED ORDER — METOPROLOL TARTRATE 25 MG PO TABS: 25.0000 mg | ORAL_TABLET | Freq: Two times a day (BID) | ORAL | Status: DC

## 2015-06-02 NOTE — Progress Notes (Signed)
Subjective:    Patient ID: Robin Ellison, female    DOB: [birth year], 39 y.o.   MRN: [id number]  DOS:  06/02/2015 Type of visit - description :  Routine checkup, not seen in 3 years Interval history: HTN: BP today is very good. Needs RFs History of VSD: Asymptomatic, see review of systems Snoring: Concerned about this issue, wonders if she has a sleep apnea. She reports she falls sleep very easily.  Review of Systems Denies fever chills No chest pain, difficulty breathing, lower extremity edema. 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 No nausea, vomiting, diarrhea  Past Medical History  Diagnosis Date  . Fibroids     s/p removal ~ 2010  . Learning disability   . Anemia     iron def, used to see hematology for IV iron  . Hypertension 03/21/2013  . Seasonal allergies     Past Surgical History  Procedure Laterality Date  . Fibroid tumor  2010    Social History   Social History  . Marital Status: Single    Spouse Name: N/A  . Number of Children: N/A  . Years of Education: N/A   Occupational History  . 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 Smoking status: Never Smoker   . Smokeless tobacco: Not on file  . Alcohol Use: Yes     Comment: rare  . Drug Use: No  . Sexual Activity: Not on file   Other Topics Concern  . Not on file   Social History Narrative   Family from [place] . Lives w/ her mother         Medication List       This list is accurate as of: 06/02/15 11:59 PM.  Always use your most recent med list.               fluticasone 50 MCG/ACT nasal spray  Commonly known as:  FLONASE  2 sprays by Nasal route daily.     metoprolol tartrate 25 MG tablet  Commonly known as:  LOPRESSOR  Take 1 tablet (25 mg total) by mouth 2 (two) times daily.           Objective:   Physical Exam BP 126/78 mmHg  Pulse 72   Temp(Src) 98.3 F (36.8 C) (Oral)  Ht 5\' 2"  (1.575 m)  Wt 234 lb (106.142 kg)  BMI 42.79 kg/m2  SpO2 98%  LMP 05/17/2015 (Approximate) General:   Well developed, well nourished . NAD.  HEENT:  Normocephalic . Face symmetric, atraumatic Lungs:  CTA B Normal respiratory effort, no intercostal retractions, no accessory muscle use. Heart: RRR,  + systolic murmur.  Trace  pretibial edema bilaterally  Skin: Not pale. Not jaundice Neurologic:  alert & oriented X3.  Speech normal, gait appropriate for age and unassisted Psych--  Cognition and judgment appear intact.  Cooperative with normal attention span and concentration.  Behavior appropriate. No anxious or depressed appearing.      Assessment & Plan:   Assessment > HTN Learning disability H/o iron def anemia, used to get  IV iron History fibroids VSD: dx 02-2013, had a murmur, ECHO VSD, LVH  Plan: Hypertension: Refill beta blockers, recommend ambulatory BPs, labs ----> declined labs today, states will come back. Will get a BMP, CBC, TSH. VSD: Was diagnosed 2 years ago, asymptomatic, we'll schedule echo and a cardiology eval.  Snoring: Declined to the Epworth Sleepiness Scale. Will reassess on return to the office Primary care: declined Td today, will get a flu shot at her pharmacy, referred to gynecology

## 2015-06-02 NOTE — Progress Notes (Signed)
Pre visit review using our clinic review tool, if applicable. No additional management support is needed unless otherwise documented below in the visit note. 

## 2015-06-03 ENCOUNTER — Telehealth: Payer: Self-pay | Admitting: Internal Medicine

## 2015-06-03 ENCOUNTER — Encounter: Payer: Self-pay | Admitting: Internal Medicine

## 2015-06-03 DIAGNOSIS — Z09 Encounter for follow-up examination after completed treatment for conditions other than malignant neoplasm: Secondary | ICD-10-CM | POA: Insufficient documentation

## 2015-06-03 DIAGNOSIS — Z01419 Encounter for gynecological examination (general) (routine) without abnormal findings: Secondary | ICD-10-CM

## 2015-06-03 NOTE — Telephone Encounter (Signed)
Referral replaced to GYN her at [facility] in [facility].

## 2015-06-03 NOTE — Telephone Encounter (Signed)
GYN referral already scheduled with Dr. Radene Knee on 06/15/2015 at 11:20 at [facility] on [address] in [place] across from the [facility].

## 2015-06-03 NOTE — Telephone Encounter (Signed)
FYI - pt got her flu shot today at work. Please update records.

## 2015-06-03 NOTE — Assessment & Plan Note (Signed)
Hypertension: Refill beta blockers, recommend ambulatory BPs, labs ----> declined labs today, states will come back. Will get a BMP, CBC, TSH. VSD: Was diagnosed 2 years ago, asymptomatic, we'll schedule echo and a cardiology eval. Snoring: Declined to the Epworth Sleepiness Scale. Will reassess on return to the office Primary care: declined Td today, will get a flu shot at her pharmacy, referred to gynecology

## 2015-06-03 NOTE — Telephone Encounter (Signed)
She will need to call and cancel at [facility]. I will place order for GYN here. Also inform Pt next time to let us know where she prefers to be referred to in [facility] or [facility], since she did not tell us either, we assumed she did not have a preference. Thank you.

## 2015-06-03 NOTE — Telephone Encounter (Signed)
I advised her of that and pt said she does not want to go to [place]. She would like to be seen at [facility].

## 2015-06-03 NOTE — Telephone Encounter (Signed)
Pt said that she would like referral for GYN in [facility] new office

## 2015-06-28 ENCOUNTER — Ambulatory Visit (INDEPENDENT_AMBULATORY_CARE_PROVIDER_SITE_OTHER): Payer: BC Managed Care – PPO | Admitting: Obstetrics & Gynecology

## 2015-06-28 ENCOUNTER — Ambulatory Visit (HOSPITAL_BASED_OUTPATIENT_CLINIC_OR_DEPARTMENT_OTHER)
Admission: RE | Admit: 2015-06-28 | Discharge: 2015-06-28 | Disposition: A | Discharge status: Home / Self Care | Payer: BC Managed Care – PPO | Source: Ambulatory Visit | Attending: Obstetrics & Gynecology | Admitting: Obstetrics & Gynecology

## 2015-06-28 ENCOUNTER — Telehealth: Payer: Self-pay | Admitting: Internal Medicine

## 2015-06-28 ENCOUNTER — Other Ambulatory Visit (INDEPENDENT_AMBULATORY_CARE_PROVIDER_SITE_OTHER): Payer: BC Managed Care – PPO

## 2015-06-28 ENCOUNTER — Encounter: Payer: Self-pay | Admitting: Obstetrics & Gynecology

## 2015-06-28 VITALS — BP 126/66 | HR 73 | Ht 61.5 in | Wt 233.0 lb

## 2015-06-28 DIAGNOSIS — Z124 Encounter for screening for malignant neoplasm of cervix: Secondary | ICD-10-CM | POA: Diagnosis not present

## 2015-06-28 DIAGNOSIS — Z01419 Encounter for gynecological examination (general) (routine) without abnormal findings: Secondary | ICD-10-CM

## 2015-06-28 DIAGNOSIS — Z1231 Encounter for screening mammogram for malignant neoplasm of breast: Secondary | ICD-10-CM | POA: Diagnosis not present

## 2015-06-28 DIAGNOSIS — Z1151 Encounter for screening for human papillomavirus (HPV): Secondary | ICD-10-CM

## 2015-06-28 DIAGNOSIS — I10 Essential (primary) hypertension: Secondary | ICD-10-CM | POA: Diagnosis not present

## 2015-06-28 DIAGNOSIS — Z114 Encounter for screening for human immunodeficiency virus [HIV]: Secondary | ICD-10-CM

## 2015-06-28 LAB — CBC WITH DIFFERENTIAL/PLATELET
BASOS ABS: 0 10*3/uL (ref 0.0–0.1)
BASOS PCT: 0.6 % (ref 0.0–3.0)
EOS PCT: 2.9 % (ref 0.0–5.0)
Eosinophils Absolute: 0.2 10*3/uL (ref 0.0–0.7)
HEMATOCRIT: 35.6 % — AB (ref 36.0–46.0)
HEMOGLOBIN: 11.2 g/dL — AB (ref 12.0–15.0)
LYMPHS ABS: 2.2 10*3/uL (ref 0.7–4.0)
Lymphocytes Relative: 40.3 % (ref 12.0–46.0)
MCHC: 31.4 g/dL (ref 30.0–36.0)
MCV: 77.8 fl — AB (ref 78.0–100.0)
MONO ABS: 0.5 10*3/uL (ref 0.1–1.0)
MONOS PCT: 8.3 % (ref 3.0–12.0)
NEUTROS ABS: 2.6 10*3/uL (ref 1.4–7.7)
Neutrophils Relative %: 47.9 % (ref 43.0–77.0)
Platelets: 353 10*3/uL (ref 150.0–400.0)
RBC: 4.58 Mil/uL (ref 3.87–5.11)
RDW: 16.2 % — ABNORMAL HIGH (ref 11.5–15.5)
WBC: 5.5 10*3/uL (ref 4.0–10.5)

## 2015-06-28 LAB — TSH: TSH: 2.38 u[IU]/mL (ref 0.35–4.50)

## 2015-06-28 LAB — BASIC METABOLIC PANEL
BUN: 9 mg/dL (ref 6–23)
CHLORIDE: 102 meq/L (ref 96–112)
CO2: 26 meq/L (ref 19–32)
Calcium: 9.5 mg/dL (ref 8.4–10.5)
Creatinine, Ser: 0.69 mg/dL (ref 0.40–1.20)
GFR: 120.95 mL/min (ref >60.00)
GLUCOSE: 92 mg/dL (ref 70–99)
POTASSIUM: 4.2 meq/L (ref 3.5–5.1)
SODIUM: 137 meq/L (ref 135–145)

## 2015-06-28 NOTE — Patient Instructions (Signed)
Preventive Care for Adults, Female A healthy lifestyle and preventive care can promote health and wellness. Preventive health guidelines for women include the following key practices.  A routine yearly physical is a good way to check with your health care provider about your health and preventive screening. It is a chance to share any concerns and updates on your health and to receive a thorough exam.  Visit your dentist for a routine exam and preventive care every 6 months. Brush your teeth twice a day and floss once a day. Good oral hygiene prevents tooth decay and gum disease.  The frequency of eye exams is based on your age, health, family medical history, use of contact lenses, and other factors. Follow your health care provider's recommendations for frequency of eye exams.  Eat a healthy diet. Foods like vegetables, fruits, whole grains, low-fat dairy products, and lean protein foods contain the nutrients you need without too many calories. Decrease your intake of foods high in solid fats, added sugars, and salt. Eat the right amount of calories for you.Get information about a proper diet from your health care provider, if necessary.  Regular physical exercise is one of the most important things you can do for your health. Most adults should get at least 150 minutes of moderate-intensity exercise (any activity that increases your heart rate and causes you to sweat) each week. In addition, most adults need muscle-strengthening exercises on 2 or more days a week.  Maintain a healthy weight. The body mass index (BMI) is a screening tool to identify possible weight problems. It provides an estimate of body fat based on height and weight. Your health care provider can find your BMI and can help you achieve or maintain a healthy weight.For adults 20 years and older:  A BMI below 18.5 is considered underweight.  A BMI of 18.5 to 24.9 is normal.  A BMI of 25 to 29.9 is considered overweight.  A  BMI of 30 and above is considered obese.  Maintain normal blood lipids and cholesterol levels by exercising and minimizing your intake of saturated fat. Eat a balanced diet with plenty of fruit and vegetables. Blood tests for lipids and cholesterol should begin at age 45 and be repeated every 5 years. If your lipid or cholesterol levels are high, you are over 50, or you are at high risk for heart disease, you may need your cholesterol levels checked more frequently.Ongoing high lipid and cholesterol levels should be treated with medicines if diet and exercise are not working.  If you smoke, find out from your health care provider how to quit. If you do not use tobacco, do not start.  Lung cancer screening is recommended for adults aged 45-80 years who are at high risk for developing lung cancer because of a history of smoking. A yearly low-dose CT scan of the lungs is recommended for people who have at least a 30-pack-year history of smoking and are a current smoker or have quit within the past 15 years. A pack year of smoking is smoking an average of 1 pack of cigarettes a day for 1 year (for example: 1 pack a day for 30 years or 2 packs a day for 15 years). Yearly screening should continue until the smoker has stopped smoking for at least 15 years. Yearly screening should be stopped for people who develop a health problem that would prevent them from having lung cancer treatment.  If you are pregnant, do not drink alcohol. If you are  breastfeeding, be very cautious about drinking alcohol. If you are not pregnant and choose to drink alcohol, do not have more than 1 drink per day. One drink is considered to be 12 ounces (355 mL) of beer, 5 ounces (148 mL) of wine, or 1.5 ounces (44 mL) of liquor.  Avoid use of street drugs. Do not share needles with anyone. Ask for help if you need support or instructions about stopping the use of drugs.  High blood pressure causes heart disease and increases the risk  of stroke. Your blood pressure should be checked at least every 1 to 2 years. Ongoing high blood pressure should be treated with medicines if weight loss and exercise do not work.  If you are 55-79 years old, ask your health care provider if you should take aspirin to prevent strokes.  Diabetes screening is done by taking a blood sample to check your blood glucose level after you have not eaten for a certain period of time (fasting). If you are not overweight and you do not have risk factors for diabetes, you should be screened once every 3 years starting at age 45. If you are overweight or obese and you are 40-70 years of age, you should be screened for diabetes every year as part of your cardiovascular risk assessment.  Breast cancer screening is essential preventive care for women. You should practice "breast self-awareness." This means understanding the normal appearance and feel of your breasts and may include breast self-examination. Any changes detected, no matter how small, should be reported to a health care provider. Women in their 20s and 30s should have a clinical breast exam (CBE) by a health care provider as part of a regular health exam every 1 to 3 years. After age 40, women should have a CBE every year. Starting at age 40, women should consider having a mammogram (breast X-ray test) every year. Women who have a family history of breast cancer should talk to their health care provider about genetic screening. Women at a high risk of breast cancer should talk to their health care providers about having an MRI and a mammogram every year.  Breast cancer gene (BRCA)-related cancer risk assessment is recommended for women who have family members with BRCA-related cancers. BRCA-related cancers include breast, ovarian, tubal, and peritoneal cancers. Having family members with these cancers may be associated with an increased risk for harmful changes (mutations) in the breast cancer genes BRCA1 and  BRCA2. Results of the assessment will determine the need for genetic counseling and BRCA1 and BRCA2 testing.  Your health care provider may recommend that you be screened regularly for cancer of the pelvic organs (ovaries, uterus, and vagina). This screening involves a pelvic examination, including checking for microscopic changes to the surface of your cervix (Pap test). You may be encouraged to have this screening done every 3 years, beginning at age 21.  For women ages 30-65, health care providers may recommend pelvic exams and Pap testing every 3 years, or they may recommend the Pap and pelvic exam, combined with testing for human papilloma virus (HPV), every 5 years. Some types of HPV increase your risk of cervical cancer. Testing for HPV may also be done on women of any age with unclear Pap test results.  Other health care providers may not recommend any screening for nonpregnant women who are considered low risk for pelvic cancer and who do not have symptoms. Ask your health care provider if a screening pelvic exam is right for   you.  If you have had past treatment for cervical cancer or a condition that could lead to cancer, you need Pap tests and screening for cancer for at least 20 years after your treatment. If Pap tests have been discontinued, your risk factors (such as having a new sexual partner) need to be reassessed to determine if screening should resume. Some women have medical problems that increase the chance of getting cervical cancer. In these cases, your health care provider may recommend more frequent screening and Pap tests.  Colorectal cancer can be detected and often prevented. Most routine colorectal cancer screening begins at the age of 50 years and continues through age 75 years. However, your health care provider may recommend screening at an earlier age if you have risk factors for colon cancer. On a yearly basis, your health care provider may provide home test kits to check  for hidden blood in the stool. Use of a small camera at the end of a tube, to directly examine the colon (sigmoidoscopy or colonoscopy), can detect the earliest forms of colorectal cancer. Talk to your health care provider about this at age 50, when routine screening begins. Direct exam of the colon should be repeated every 5-10 years through age 75 years, unless early forms of precancerous polyps or small growths are found.  People who are at an increased risk for hepatitis B should be screened for this virus. You are considered at high risk for hepatitis B if:  You were born in a country where hepatitis B occurs often. Talk with your health care provider about which countries are considered high risk.  Your parents were born in a high-risk country and you have not received a shot to protect against hepatitis B (hepatitis B vaccine).  You have HIV or AIDS.  You use needles to inject street drugs.  You live with, or have sex with, someone who has hepatitis B.  You get hemodialysis treatment.  You take certain medicines for conditions like cancer, organ transplantation, and autoimmune conditions.  Hepatitis C blood testing is recommended for all people born from 1945 through 1965 and any individual with known risks for hepatitis C.  Practice safe sex. Use condoms and avoid high-risk sexual practices to reduce the spread of sexually transmitted infections (STIs). STIs include gonorrhea, chlamydia, syphilis, trichomonas, herpes, HPV, and human immunodeficiency virus (HIV). Herpes, HIV, and HPV are viral illnesses that have no cure. They can result in disability, cancer, and death.  You should be screened for sexually transmitted illnesses (STIs) including gonorrhea and chlamydia if:  You are sexually active and are younger than 24 years.  You are older than 24 years and your health care provider tells you that you are at risk for this type of infection.  Your sexual activity has changed  since you were last screened and you are at an increased risk for chlamydia or gonorrhea. Ask your health care provider if you are at risk.  If you are at risk of being infected with HIV, it is recommended that you take a prescription medicine daily to prevent HIV infection. This is called preexposure prophylaxis (PrEP). You are considered at risk if:  You are sexually active and do not regularly use condoms or know the HIV status of your partner(s).  You take drugs by injection.  You are sexually active with a partner who has HIV.  Talk with your health care provider about whether you are at high risk of being infected with HIV. If   you choose to begin PrEP, you should first be tested for HIV. You should then be tested every 3 months for as long as you are taking PrEP.  Osteoporosis is a disease in which the bones lose minerals and strength with aging. This can result in serious bone fractures or breaks. The risk of osteoporosis can be identified using a bone density scan. Women ages 67 years and over and women at risk for fractures or osteoporosis should discuss screening with their health care providers. Ask your health care provider whether you should take a calcium supplement or vitamin D to reduce the rate of osteoporosis.  Menopause can be associated with physical symptoms and risks. Hormone replacement therapy is available to decrease symptoms and risks. You should talk to your health care provider about whether hormone replacement therapy is right for you.  Use sunscreen. Apply sunscreen liberally and repeatedly throughout the day. You should seek shade when your shadow is shorter than you. Protect yourself by wearing long sleeves, pants, a wide-brimmed hat, and sunglasses year round, whenever you are outdoors.  Once a month, do a whole body skin exam, using a mirror to look at the skin on your back. Tell your health care provider of new moles, moles that have irregular borders, moles that  are larger than a pencil eraser, or moles that have changed in shape or color.  Stay current with required vaccines (immunizations).  Influenza vaccine. All adults should be immunized every year.  Tetanus, diphtheria, and acellular pertussis (Td, Tdap) vaccine. Pregnant women should receive 1 dose of Tdap vaccine during each pregnancy. The dose should be obtained regardless of the length of time since the last dose. Immunization is preferred during the 27th-36th week of gestation. An adult who has not previously received Tdap or who does not know her vaccine status should receive 1 dose of Tdap. This initial dose should be followed by tetanus and diphtheria toxoids (Td) booster doses every 10 years. Adults with an unknown or incomplete history of completing a 3-dose immunization series with Td-containing vaccines should begin or complete a primary immunization series including a Tdap dose. Adults should receive a Td booster every 10 years.  Varicella vaccine. An adult without evidence of immunity to varicella should receive 2 doses or a second dose if she has previously received 1 dose. Pregnant females who do not have evidence of immunity should receive the first dose after pregnancy. This first dose should be obtained before leaving the health care facility. The second dose should be obtained 4-8 weeks after the first dose.  Human papillomavirus (HPV) vaccine. Females aged 13-26 years who have not received the vaccine previously should obtain the 3-dose series. The vaccine is not recommended for use in pregnant females. However, pregnancy testing is not needed before receiving a dose. If a female is found to be pregnant after receiving a dose, no treatment is needed. In that case, the remaining doses should be delayed until after the pregnancy. Immunization is recommended for any person with an immunocompromised condition through the age of 61 years if she did not get any or all doses earlier. During the  3-dose series, the second dose should be obtained 4-8 weeks after the first dose. The third dose should be obtained 24 weeks after the first dose and 16 weeks after the second dose.  Zoster vaccine. One dose is recommended for adults aged 30 years or older unless certain conditions are present.  Measles, mumps, and rubella (MMR) vaccine. Adults born  before 1957 generally are considered immune to measles and mumps. Adults born in 1957 or later should have 1 or more doses of MMR vaccine unless there is a contraindication to the vaccine or there is laboratory evidence of immunity to each of the three diseases. A routine second dose of MMR vaccine should be obtained at least 28 days after the first dose for students attending postsecondary schools, health care workers, or international travelers. People who received inactivated measles vaccine or an unknown type of measles vaccine during 1963-1967 should receive 2 doses of MMR vaccine. People who received inactivated mumps vaccine or an unknown type of mumps vaccine before 1979 and are at high risk for mumps infection should consider immunization with 2 doses of MMR vaccine. For females of childbearing age, rubella immunity should be determined. If there is no evidence of immunity, females who are not pregnant should be vaccinated. If there is no evidence of immunity, females who are pregnant should delay immunization until after pregnancy. Unvaccinated health care workers born before 1957 who lack laboratory evidence of measles, mumps, or rubella immunity or laboratory confirmation of disease should consider measles and mumps immunization with 2 doses of MMR vaccine or rubella immunization with 1 dose of MMR vaccine.  Pneumococcal 13-valent conjugate (PCV13) vaccine. When indicated, a person who is uncertain of his immunization history and has no record of immunization should receive the PCV13 vaccine. All adults 65 years of age and older should receive this  vaccine. An adult aged 19 years or older who has certain medical conditions and has not been previously immunized should receive 1 dose of PCV13 vaccine. This PCV13 should be followed with a dose of pneumococcal polysaccharide (PPSV23) vaccine. Adults who are at high risk for pneumococcal disease should obtain the PPSV23 vaccine at least 8 weeks after the dose of PCV13 vaccine. Adults older than 40 years of age who have normal immune system function should obtain the PPSV23 vaccine dose at least 1 year after the dose of PCV13 vaccine.  Pneumococcal polysaccharide (PPSV23) vaccine. When PCV13 is also indicated, PCV13 should be obtained first. All adults aged 65 years and older should be immunized. An adult younger than age 65 years who has certain medical conditions should be immunized. Any person who resides in a nursing home or long-term care facility should be immunized. An adult smoker should be immunized. People with an immunocompromised condition and certain other conditions should receive both PCV13 and PPSV23 vaccines. People with human immunodeficiency virus (HIV) infection should be immunized as soon as possible after diagnosis. Immunization during chemotherapy or radiation therapy should be avoided. Routine use of PPSV23 vaccine is not recommended for American Indians, [place] Natives, or people younger than 65 years unless there are medical conditions that require PPSV23 vaccine. When indicated, people who have unknown immunization and have no record of immunization should receive PPSV23 vaccine. One-time revaccination 5 years after the first dose of PPSV23 is recommended for people aged 19-64 years who have chronic kidney failure, nephrotic syndrome, asplenia, or immunocompromised conditions. People who received 1-2 doses of PPSV23 before age 65 years should receive another dose of PPSV23 vaccine at age 65 years or later if at least 5 years have passed since the previous dose. Doses of PPSV23 are not  needed for people immunized with PPSV23 at or after age 65 years.  Meningococcal vaccine. Adults with asplenia or persistent complement component deficiencies should receive 2 doses of quadrivalent meningococcal conjugate (MenACWY-D) vaccine. The doses should be obtained   at least 2 months apart. Microbiologists working with certain meningococcal bacteria, [place] recruits, people at risk during an outbreak, and people who travel to or live in countries with a high rate of meningitis should be immunized. A first-year college student up through age 34 years who is living in a residence hall should receive a dose if she did not receive a dose on or after her 16th birthday. Adults who have certain high-risk conditions should receive one or more doses of vaccine.  Hepatitis A vaccine. Adults who wish to be protected from this disease, have certain high-risk conditions, work with hepatitis A-infected animals, work in hepatitis A research labs, or travel to or work in countries with a high rate of hepatitis A should be immunized. Adults who were previously unvaccinated and who anticipate close contact with an international adoptee during the first 60 days after arrival in the [place] from a country with a high rate of hepatitis A should be immunized.  Hepatitis B vaccine. Adults who wish to be protected from this disease, have certain high-risk conditions, may be exposed to blood or other infectious body fluids, are household contacts or sex partners of hepatitis B positive people, are clients or workers in certain care facilities, or travel to or work in countries with a high rate of hepatitis B should be immunized.  Haemophilus influenzae type b (Hib) vaccine. A previously unvaccinated person with asplenia or sickle cell disease or having a scheduled splenectomy should receive 1 dose of Hib vaccine. Regardless of previous immunization, a recipient of a hematopoietic stem cell transplant should receive a  3-dose series 6-12 months after her successful transplant. Hib vaccine is not recommended for adults with HIV infection. Preventive Services / Frequency Ages 35 to 4 years  Blood pressure check.** / Every 3-5 years.  Lipid and cholesterol check.** / Every 5 years beginning at age 60.  Clinical breast exam.** / Every 3 years for women in their 71s and 10s.  BRCA-related cancer risk assessment.** / For women who have family members with a BRCA-related cancer (breast, ovarian, tubal, or peritoneal cancers).  Pap test.** / Every 2 years from ages 76 through 26. Every 3 years starting at age 61 through age 76 or 93 with a history of 3 consecutive normal Pap tests.  HPV screening.** / Every 3 years from ages 37 through ages 60 to 51 with a history of 3 consecutive normal Pap tests.  Hepatitis C blood test.** / For any individual with known risks for hepatitis C.  Skin self-exam. / Monthly.  Influenza vaccine. / Every year.  Tetanus, diphtheria, and acellular pertussis (Tdap, Td) vaccine.** / Consult your health care provider. Pregnant women should receive 1 dose of Tdap vaccine during each pregnancy. 1 dose of Td every 10 years.  Varicella vaccine.** / Consult your health care provider. Pregnant females who do not have evidence of immunity should receive the first dose after pregnancy.  HPV vaccine. / 3 doses over 6 months, if 93 and younger. The vaccine is not recommended for use in pregnant females. However, pregnancy testing is not needed before receiving a dose.  Measles, mumps, rubella (MMR) vaccine.** / You need at least 1 dose of MMR if you were born in 1957 or later. You may also need a 2nd dose. For females of childbearing age, rubella immunity should be determined. If there is no evidence of immunity, females who are not pregnant should be vaccinated. If there is no evidence of immunity, females who are  pregnant should delay immunization until after pregnancy.  Pneumococcal  13-valent conjugate (PCV13) vaccine.** / Consult your health care provider.  Pneumococcal polysaccharide (PPSV23) vaccine.** / 1 to 2 doses if you smoke cigarettes or if you have certain conditions.  Meningococcal vaccine.** / 1 dose if you are age 68 to 8 years and a Market researcher living in a residence hall, or have one of several medical conditions, you need to get vaccinated against meningococcal disease. You may also need additional booster doses.  Hepatitis A vaccine.** / Consult your health care provider.  Hepatitis B vaccine.** / Consult your health care provider.  Haemophilus influenzae type b (Hib) vaccine.** / Consult your health care provider. Ages 7 to 53 years  Blood pressure check.** / Every year.  Lipid and cholesterol check.** / Every 5 years beginning at age 25 years.  Lung cancer screening. / Every year if you are aged 11-80 years and have a 30-pack-year history of smoking and currently smoke or have quit within the past 15 years. Yearly screening is stopped once you have quit smoking for at least 15 years or develop a health problem that would prevent you from having lung cancer treatment.  Clinical breast exam.** / Every year after age 48 years.  BRCA-related cancer risk assessment.** / For women who have family members with a BRCA-related cancer (breast, ovarian, tubal, or peritoneal cancers).  Mammogram.** / Every year beginning at age 41 years and continuing for as long as you are in good health. Consult with your health care provider.  Pap test.** / Every 3 years starting at age 65 years through age 37 or 70 years with a history of 3 consecutive normal Pap tests.  HPV screening.** / Every 3 years from ages 72 years through ages 60 to 40 years with a history of 3 consecutive normal Pap tests.  Fecal occult blood test (FOBT) of stool. / Every year beginning at age 21 years and continuing until age 5 years. You may not need to do this test if you get  a colonoscopy every 10 years.  Flexible sigmoidoscopy or colonoscopy.** / Every 5 years for a flexible sigmoidoscopy or every 10 years for a colonoscopy beginning at age 35 years and continuing until age 48 years.  Hepatitis C blood test.** / For all people born from [birth year] through 1965 and any individual with known risks for hepatitis C.  Skin self-exam. / Monthly.  Influenza vaccine. / Every year.  Tetanus, diphtheria, and acellular pertussis (Tdap/Td) vaccine.** / Consult your health care provider. Pregnant women should receive 1 dose of Tdap vaccine during each pregnancy. 1 dose of Td every 10 years.  Varicella vaccine.** / Consult your health care provider. Pregnant females who do not have evidence of immunity should receive the first dose after pregnancy.  Zoster vaccine.** / 1 dose for adults aged 30 years or older.  Measles, mumps, rubella (MMR) vaccine.** / You need at least 1 dose of MMR if you were born in 1957 or later. You may also need a second dose. For females of childbearing age, rubella immunity should be determined. If there is no evidence of immunity, females who are not pregnant should be vaccinated. If there is no evidence of immunity, females who are pregnant should delay immunization until after pregnancy.  Pneumococcal 13-valent conjugate (PCV13) vaccine.** / Consult your health care provider.  Pneumococcal polysaccharide (PPSV23) vaccine.** / 1 to 2 doses if you smoke cigarettes or if you have certain conditions.  Meningococcal vaccine.** /  Consult your health care provider.  Hepatitis A vaccine.** / Consult your health care provider.  Hepatitis B vaccine.** / Consult your health care provider.  Haemophilus influenzae type b (Hib) vaccine.** / Consult your health care provider. Ages 64 years and over  Blood pressure check.** / Every year.  Lipid and cholesterol check.** / Every 5 years beginning at age 23 years.  Lung cancer screening. / Every year if you  are aged 16-80 years and have a 30-pack-year history of smoking and currently smoke or have quit within the past 15 years. Yearly screening is stopped once you have quit smoking for at least 15 years or develop a health problem that would prevent you from having lung cancer treatment.  Clinical breast exam.** / Every year after age 74 years.  BRCA-related cancer risk assessment.** / For women who have family members with a BRCA-related cancer (breast, ovarian, tubal, or peritoneal cancers).  Mammogram.** / Every year beginning at age 44 years and continuing for as long as you are in good health. Consult with your health care provider.  Pap test.** / Every 3 years starting at age 58 years through age 22 or 39 years with 3 consecutive normal Pap tests. Testing can be stopped between 65 and 70 years with 3 consecutive normal Pap tests and no abnormal Pap or HPV tests in the past 10 years.  HPV screening.** / Every 3 years from ages 64 years through ages 70 or 61 years with a history of 3 consecutive normal Pap tests. Testing can be stopped between 65 and 70 years with 3 consecutive normal Pap tests and no abnormal Pap or HPV tests in the past 10 years.  Fecal occult blood test (FOBT) of stool. / Every year beginning at age 40 years and continuing until age 27 years. You may not need to do this test if you get a colonoscopy every 10 years.  Flexible sigmoidoscopy or colonoscopy.** / Every 5 years for a flexible sigmoidoscopy or every 10 years for a colonoscopy beginning at age 7 years and continuing until age 32 years.  Hepatitis C blood test.** / For all people born from [birth year] through 1966 and any individual with known risks for hepatitis C.  Osteoporosis screening.** / A one-time screening for women ages 30 years and over and women at risk for fractures or osteoporosis.  Skin self-exam. / Monthly.  Influenza vaccine. / Every year.  Tetanus, diphtheria, and acellular pertussis (Tdap/Td)  vaccine.** / 1 dose of Td every 10 years.  Varicella vaccine.** / Consult your health care provider.  Zoster vaccine.** / 1 dose for adults aged 35 years or older.  Pneumococcal 13-valent conjugate (PCV13) vaccine.** / Consult your health care provider.  Pneumococcal polysaccharide (PPSV23) vaccine.** / 1 dose for all adults aged 46 years and older.  Meningococcal vaccine.** / Consult your health care provider.  Hepatitis A vaccine.** / Consult your health care provider.  Hepatitis B vaccine.** / Consult your health care provider.  Haemophilus influenzae type b (Hib) vaccine.** / Consult your health care provider. ** Family history and personal history of risk and conditions may change your health care provider's recommendations.   This information is not intended to replace advice given to you by your health care provider. Make sure you discuss any questions you have with your health care provider.   Document Released: 10/10/2001 Document Revised: 09/04/2014 Document Reviewed: 01/09/2011 Elsevier Interactive Patient Education [employer].

## 2015-06-28 NOTE — Progress Notes (Signed)
GYNECOLOGY CLINIC ANNUAL PREVENTATIVE CARE ENCOUNTER NOTE  Subjective:   Robin Ellison is a 39 y.o. G26  female here for a routine annual gynecologic exam.  Current complaints: none.   Denies abnormal vaginal bleeding, discharge, pelvic pain or other gynecologic concerns.    Gynecologic History Patient's last menstrual period was 06/03/2015. Contraception: abstinence for now. Last Pap: several years ago. Results were: normal Never had a mammogram.  Obstetric History OB History  No data available    Past Medical History  Diagnosis Date  . Fibroids     s/p removal ~ 2010  . Learning disability   . Anemia     iron def, used to see hematology for IV iron  . Hypertension 03/21/2013  . Seasonal allergies     Past Surgical History  Procedure Laterality Date  . Myomectomy  2010    Current Outpatient Prescriptions on File Prior to Visit  Medication Sig Dispense Refill  . metoprolol tartrate (LOPRESSOR) 25 MG tablet Take 1 tablet (25 mg total) by mouth 2 (two) times daily. 60 tablet 6  . fluticasone (FLONASE) 50 MCG/ACT nasal spray 2 sprays by Nasal route daily. (Patient not taking: Reported on 06/28/2015) 16 g 2   No current facility-administered medications on file prior to visit.    No Known Allergies  Social History   Social History  . Marital Status: Single    Spouse Name: N/A  . Number of Children: N/A  . Years of Education: N/A   Occupational History  . 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 Smoking status: Never Smoker   . Smokeless tobacco: Not on file  . Alcohol Use: Yes     Comment: rare  . Drug Use: No  . Sexual Activity: No   Other Topics Concern  . Not on file   Social History Narrative   Family from [place] . Lives w/ her mother     Family History  Problem Relation Age of Onset  . Kidney disease Father     End sate  . Coronary artery disease Father   . Diabetes     grandmother  . Heart disease Paternal Aunt   . Cancer Paternal Aunt   . Diabetes Maternal Uncle   . Healthy Mother     The following portions of the patient's history were reviewed and updated as appropriate: allergies, current medications, past family history, past medical history, past social history, past surgical history and problem list.  Review of Systems Pertinent items noted in HPI and remainder of comprehensive ROS otherwise negative.   Objective:  BP 126/66 mmHg  Pulse 73  Ht 5' 1.5" (1.562 m)  Wt 233 lb (105.688 kg)  BMI 43.32 kg/m2  LMP 06/03/2015 CONSTITUTIONAL: Well-developed, well-nourished female in no acute distress.  HENT:  Normocephalic, atraumatic, External right and left ear normal. Oropharynx is clear and moist EYES: Conjunctivae and EOM are normal. Pupils are equal, round, and reactive to light. No scleral icterus.  NECK: Normal range of motion, supple, no masses.  Normal thyroid.  SKIN: Skin is warm and dry. No rash noted. Not diaphoretic. No erythema. No pallor. [place]: Alert and oriented to person, place, and time. Normal reflexes, muscle tone coordination. No cranial nerve deficit noted. PSYCHIATRIC: Normal mood and affect. Normal behavior. Normal judgment and thought content. CARDIOVASCULAR: Normal heart rate noted, regular rhythm RESPIRATORY: Clear to auscultation bilaterally. Effort and breath sounds normal, no problems  with respiration noted. BREASTS: Symmetric in size. No masses, skin changes, nipple drainage, or lymphadenopathy. Resolving candidal rash underneath both breasts. ABDOMEN: Soft, obese, normal bowel sounds, no distention appreciated.  No tenderness, rebound or guarding.  PELVIC: Normal appearing external genitalia; normal appearing vaginal mucosa and cervix.  Normal appearing discharge.  Pap smear obtained.  Unable to palpate uterus or adnexa secondary to habitus.  MUSCULOSKELETAL: Normal range of motion. No tenderness.  No cyanosis,  clubbing, or edema.  2+ distal pulses.   Assessment:  Annual gynecologic examination with pap smear   Plan:  Will follow up results of pap smear and manage accordingly. Mammogram scheduled Routine preventative health maintenance measures emphasized. Please refer to After Visit Summary for other counseling recommendations.    Verita Schneiders, MD, [place] Attending Obstetrician & Gynecologist, [place] [facility]

## 2015-06-28 NOTE — Telephone Encounter (Signed)
Caller name: Jourdin Relation to pt: self  Call back number: [phone] Pharmacy:  Reason for call: Pt had lab appt and wanted to inform the name of a cream that she takes for rash problems, the name is Calmoseptine Ointment (for external use). Pt states on her last visit she was asked about the cream she was using but at the moment did not remember the name of cream and since she was at lab just gave an update of name to be put on her record.

## 2015-06-28 NOTE — Telephone Encounter (Signed)
Noted, medication added to med list.

## 2015-06-29 LAB — HIV ANTIBODY (ROUTINE TESTING W REFLEX): HIV: NONREACTIVE

## 2015-07-01 ENCOUNTER — Telehealth: Payer: Self-pay | Admitting: Internal Medicine

## 2015-07-01 DIAGNOSIS — D649 Anemia, unspecified: Secondary | ICD-10-CM

## 2015-07-01 LAB — CYTOLOGY - PAP

## 2015-07-01 NOTE — Telephone Encounter (Signed)
Caller name:Tanya from [employer]  Call back number: [phone]   Reason for call:  Called to inform you the add on from yesterday ferritin and iron has to run within 48 hours; please call lab to follow up

## 2015-07-01 NOTE — Telephone Encounter (Signed)
LMOM informing Pt to return call.  

## 2015-07-01 NOTE — Telephone Encounter (Signed)
Noted, will inform Dr. Larose Kells.

## 2015-07-01 NOTE — Telephone Encounter (Signed)
Please call the patient and schedule a redraw

## 2015-07-05 NOTE — Telephone Encounter (Signed)
Best # [phone]

## 2015-07-05 NOTE — Telephone Encounter (Signed)
Pt calling in for her lab results. Requesting call back in the morning.

## 2015-07-06 ENCOUNTER — Telehealth: Payer: Self-pay | Admitting: Internal Medicine

## 2015-07-06 NOTE — Telephone Encounter (Signed)
Caller name: Self   Can be reached: [phone]  Reason for call: Returning call for lab results. Patient states she did not receive a message on 07/01/2015.

## 2015-07-06 NOTE — Addendum Note (Signed)
Addended by: Wilfrid Lund on: 07/06/2015 03:37 PM   Modules accepted: Orders

## 2015-07-06 NOTE — Telephone Encounter (Signed)
Spoke with Robin Ellison, informed her of lab results. Informed Robin Ellison that Dr. Larose Kells would like to check additional tests for anemia, iron and ferritin. Offered to schedule Robin Ellison a lab appt, she declined and stated she would call in several days for a lab appt.

## 2015-07-06 NOTE — Telephone Encounter (Signed)
See phone note from 07/01/2015. Duplicate telephone note.

## 2015-07-06 NOTE — Telephone Encounter (Signed)
Caller name: Shandrell  Relationship to patient: Self   Can be reached: [phone]  Reason for call: pt is calling in to get her lab results.

## 2015-11-10 ENCOUNTER — Other Ambulatory Visit: Payer: Self-pay | Admitting: Internal Medicine

## 2015-11-10 NOTE — Telephone Encounter (Signed)
Caller name:Self  Can be reached: [phone] or    Reason for call: Patient wants to know if there is something that can be rx for car sickness, plane sickness and train sickness. States she has a trip coming up in May and needs to be prepared.

## 2015-11-10 NOTE — Telephone Encounter (Signed)
Please advise 

## 2015-11-15 ENCOUNTER — Telehealth: Payer: Self-pay | Admitting: Internal Medicine

## 2015-11-15 MED ORDER — SCOPOLAMINE 1 MG/3DAYS TD PT72: 1.0000 | MEDICATED_PATCH | TRANSDERMAL | Status: DC

## 2015-11-15 NOTE — Telephone Encounter (Signed)
[phone] [place] [place]  [facility] [address] [facility] out of [facility] on med scopolamine

## 2015-11-15 NOTE — Telephone Encounter (Signed)
Motion sickness come be treated with scopolamine, one patch every 3 days. A RX is ready to be send, send enough to cover her trip I also recommend the patient to try it before her trip to  Be sure she tolerates the medication. It is pregnancy category C, cannot take it if pregnant.

## 2015-11-15 NOTE — Telephone Encounter (Signed)
LMOM informing Pt that [facility], w/ recommendations below. Instructed her to call if she has any questions or concerns.

## 2015-11-15 NOTE — Telephone Encounter (Signed)
Pt said pharmacy told her meds were $180+ and she cannot afford that. Pt asked if there is an alternative.   Ph# [phone]

## 2015-11-15 NOTE — Telephone Encounter (Signed)
Rx re-sent. Left detailed message on pt's voicemail and to call if any questions.

## 2015-11-16 MED ORDER — PROMETHAZINE HCL 12.5 MG PO TABS: 12.5000 mg | ORAL_TABLET | Freq: Four times a day (QID) | ORAL | Status: DC | PRN

## 2015-11-16 NOTE — Telephone Encounter (Signed)
She could try Phenergan 12.5 mg one tablet every 6 hours as needed. Watch for somnolence, #30, no refills

## 2015-11-16 NOTE — Telephone Encounter (Signed)
Please advise 

## 2015-11-16 NOTE — Addendum Note (Signed)
Addended by: Damita Dunnings D on: 11/16/2015 11:48 AM   Modules accepted: Orders, Medications

## 2015-11-16 NOTE — Telephone Encounter (Signed)
LMOM informing Pt to try Phenergan 12.5 mg 1 tablet every 6 hours PRN (Rx sent). Instructed her to watch out for drowsiness and to try medication before trip to see how she tolerates meds. Instructed her to call if she has any questions or concerns.

## 2015-12-15 ENCOUNTER — Telehealth: Payer: Self-pay | Admitting: Internal Medicine

## 2015-12-15 NOTE — Telephone Encounter (Signed)
Flonase 2 sprays on each side of the nose every morning Claritin 10 mg OTC 1 tablet daily Both are OTC If not better needs office visit.

## 2015-12-15 NOTE — Telephone Encounter (Signed)
Please advise 

## 2015-12-15 NOTE — Telephone Encounter (Signed)
Relation to pt: self  Call back number: work # [phone]  Pharmacy:  Reason for call:  Patient in need of clinical advice regarding an OTC medication for head pain and stuffy nose patient states allergy related. Please advise

## 2015-12-15 NOTE — Telephone Encounter (Signed)
LMOM informing Pt of recommendations. Informed her that she would need to come into office if symptoms are not improving or if she becomes worse. Instructed her to call if further questions or concerns.

## 2016-01-25 ENCOUNTER — Telehealth: Payer: Self-pay | Admitting: Internal Medicine

## 2016-01-25 NOTE — Telephone Encounter (Addendum)
Relation to [id number] Call back number:[phone]   Reason for call:  Patient would like a sleep apnea conducted, advised patient to schedule appointment states she's unable to do so at this time and would like to know where Dr. Larose Kells would send her. Please advise

## 2016-01-26 NOTE — Telephone Encounter (Signed)
Please advise 

## 2016-01-26 NOTE — Telephone Encounter (Signed)
LMOM informing Pt that Dr. Larose Kells would like for her to call and schedule appt at her convenience to discuss.

## 2016-01-26 NOTE — Telephone Encounter (Signed)
I review the notes, previously there was no concern of sleep apnea, if she is concerned needs office visit

## 2016-02-16 ENCOUNTER — Other Ambulatory Visit: Payer: Self-pay | Admitting: Internal Medicine

## 2016-03-16 ENCOUNTER — Other Ambulatory Visit: Payer: Self-pay | Admitting: Internal Medicine

## 2016-04-29 ENCOUNTER — Other Ambulatory Visit: Payer: Self-pay | Admitting: Internal Medicine

## 2016-06-23 ENCOUNTER — Telehealth: Payer: Self-pay | Admitting: Internal Medicine

## 2016-06-23 NOTE — Telephone Encounter (Signed)
Pt dropped off document for PCP to see about BP and Medical records (document in a big yellow envelope). Document put at front office tray.

## 2016-06-26 ENCOUNTER — Ambulatory Visit (INDEPENDENT_AMBULATORY_CARE_PROVIDER_SITE_OTHER): Payer: BC Managed Care – PPO | Admitting: Internal Medicine

## 2016-06-26 ENCOUNTER — Encounter: Payer: Self-pay | Admitting: Internal Medicine

## 2016-06-26 VITALS — BP 122/78 | HR 77 | Temp 97.9°F | Resp 14 | Ht 62.0 in | Wt 237.4 lb

## 2016-06-26 DIAGNOSIS — L680 Hirsutism: Secondary | ICD-10-CM

## 2016-06-26 DIAGNOSIS — I10 Essential (primary) hypertension: Secondary | ICD-10-CM | POA: Diagnosis not present

## 2016-06-26 DIAGNOSIS — Q210 Ventricular septal defect: Secondary | ICD-10-CM

## 2016-06-26 DIAGNOSIS — D649 Anemia, unspecified: Secondary | ICD-10-CM

## 2016-06-26 LAB — CBC WITH DIFFERENTIAL/PLATELET
BASOS ABS: 0 10*3/uL (ref 0.0–0.1)
Basophils Relative: 0.5 % (ref 0.0–3.0)
EOS PCT: 4.5 % (ref 0.0–5.0)
Eosinophils Absolute: 0.3 10*3/uL (ref 0.0–0.7)
HCT: 35 % — ABNORMAL LOW (ref 36.0–46.0)
HEMOGLOBIN: 11.1 g/dL — AB (ref 12.0–15.0)
Lymphocytes Relative: 37.4 % (ref 12.0–46.0)
Lymphs Abs: 2.5 10*3/uL (ref 0.7–4.0)
MCHC: 31.7 g/dL (ref 30.0–36.0)
MCV: 77 fl — ABNORMAL LOW (ref 78.0–100.0)
MONO ABS: 0.6 10*3/uL (ref 0.1–1.0)
Monocytes Relative: 8.6 % (ref 3.0–12.0)
Neutro Abs: 3.3 10*3/uL (ref 1.4–7.7)
Neutrophils Relative %: 49 % (ref 43.0–77.0)
Platelets: 345 10*3/uL (ref 150.0–400.0)
RBC: 4.55 Mil/uL (ref 3.87–5.11)
RDW: 17.3 % — ABNORMAL HIGH (ref 11.5–15.5)
WBC: 6.7 10*3/uL (ref 4.0–10.5)

## 2016-06-26 LAB — BASIC METABOLIC PANEL
BUN: 11 mg/dL (ref 6–23)
CO2: 28 mEq/L (ref 19–32)
Calcium: 9.6 mg/dL (ref 8.4–10.5)
Chloride: 103 mEq/L (ref 96–112)
Creatinine, Ser: 0.82 mg/dL (ref 0.40–1.20)
GFR: 98.62 mL/min (ref >60.00)
Glucose, Bld: 86 mg/dL (ref 70–99)
POTASSIUM: 4.2 meq/L (ref 3.5–5.1)
SODIUM: 138 meq/L (ref 135–145)

## 2016-06-26 MED ORDER — METOPROLOL TARTRATE 25 MG PO TABS: 25.0000 mg | ORAL_TABLET | Freq: Two times a day (BID) | ORAL | 5 refills | Status: DC

## 2016-06-26 NOTE — Patient Instructions (Signed)
GO TO THE LAB : Get the blood work     GO TO THE FRONT DESK Schedule your next appointment for a  complete physical exam in 3-4 months. Fasting.

## 2016-06-26 NOTE — Progress Notes (Signed)
   Subjective:    Patient ID: Robin Ellison, female    DOB: 07/23/1976, 40 y.o.   MRN: [id number]  DOS:  06/26/2016 Type of visit - description : Here for follow-up Interval history:  HTN: Good med compliance, ambulatory BPs within normal VSD: Due for a echo Complain of facial hair since age 16.   history of anemia. Reports periods are not heavy, last 3-6 days, every 4 weeks.  Review of Systems No chest pain, difficulty breathing, lower extremity edema. No palpitations or DOE No nausea, vomiting, diarrhea  Past Medical History:  Diagnosis Date  . Anemia    iron def, used to see hematology for IV iron  . Fibroids    s/p removal ~ 2010  . Hypertension 03/21/2013  . Learning disability   . Seasonal allergies     Past Surgical History:  Procedure Laterality Date  . MYOMECTOMY  2010    Social History   Social History  . Marital status: Single    Spouse name: N/A  . Number of children: 0  . Years of education: N/A   Occupational History  . 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 Smoking status: Never Smoker  . Smokeless tobacco: Never Used  . Alcohol use Yes     Comment: rare  . Drug use: No  . Sexual activity: No   Other Topics Concern  . Not on file   Social History Narrative   Family from [place] . Lives w/ her mother    [id number]        Medication List       Accurate as of 06/26/16 11:59 PM. Always use your most recent med list.          metoprolol tartrate 25 MG tablet Commonly known as:  LOPRESSOR Take 1 tablet (25 mg total) by mouth 2 (two) times daily.          Objective:   Physical Exam BP 122/78 (BP Location: Left Arm, Patient Position: Sitting, Cuff Size: Normal)   Pulse 77   Temp 97.9 F (36.6 C) (Oral)   Resp 14   Ht 5\' 2"  (1.575 m)   Wt 237 lb 6 oz (107.7 kg)   SpO2 98%   BMI 43.42 kg/m  General:   Well developed, well nourished . NAD.  HEENT:  Normocephalic . Face  symmetric, atraumatic Lungs:  CTA B Normal respiratory effort, no intercostal retractions, no accessory muscle use. Heart: Regular rate and rhythm, + systolic murmur, increased S2.  no pretibial edema bilaterally  Abdomen:  Not distended, soft, non-tender. No rebound or rigidity.  Skin: Not pale. Not jaundice Neurologic:  alert & oriented X3.  Speech normal, gait appropriate for age and unassisted Psych--  Cognition and judgment appear intact.  Cooperative with normal attention span and concentration.  Behavior appropriate. No anxious or depressed appearing.    Assessment & Plan:   Assessment > HTN Learning disability H/o iron def anemia, used to get  IV iron History fibroids VSD: dx 02-2013, had a murmur, ECHO VSD, LVH    PLAN HTN: Continue Lopressor, check a BMP VSD: EKG today NSR, Rx a Echo Hysutism: Chronic since age 57, refer to endocrinology H/o anemia: Check labs RTC, CPX in 3-4  for months

## 2016-06-26 NOTE — Progress Notes (Signed)
Pre visit review using our clinic review tool, if applicable. No additional management support is needed unless otherwise documented below in the visit note. 

## 2016-06-27 NOTE — Assessment & Plan Note (Signed)
HTN: Continue Lopressor, check a BMP VSD: EKG today NSR, Rx a Echo Hysutism: Chronic since age 40, refer to endocrinology H/o anemia: Check labs RTC, CPX in 3-4  for months

## 2016-08-23 ENCOUNTER — Encounter: Payer: Self-pay | Admitting: Internal Medicine

## 2016-08-23 ENCOUNTER — Ambulatory Visit (INDEPENDENT_AMBULATORY_CARE_PROVIDER_SITE_OTHER): Payer: BC Managed Care – PPO | Admitting: Internal Medicine

## 2016-08-23 VITALS — BP 135/84 | HR 72 | Wt 238.0 lb

## 2016-08-23 DIAGNOSIS — L680 Hirsutism: Secondary | ICD-10-CM | POA: Diagnosis not present

## 2016-08-23 LAB — T4, FREE: FREE T4: 0.8 ng/dL (ref 0.60–1.60)

## 2016-08-23 LAB — FOLLICLE STIMULATING HORMONE: FSH: 27.4 m[IU]/mL

## 2016-08-23 LAB — TSH: TSH: 2.52 u[IU]/mL (ref 0.35–4.50)

## 2016-08-23 LAB — T3, FREE: T3, Free: 3.3 pg/mL (ref 2.3–4.2)

## 2016-08-23 LAB — LUTEINIZING HORMONE: LH: 26.4 m[IU]/mL

## 2016-08-23 MED ORDER — EFLORNITHINE HCL 13.9 % EX CREA: TOPICAL_CREAM | CUTANEOUS | 2 refills | Status: DC

## 2016-08-23 NOTE — Progress Notes (Signed)
Patient ID: Robin Ellison, female   DOB: 1975-11-12, 40 y.o.   MRN: [id number]   HPI: Robin Ellison is a 40 y.o. female, referred by Dr. Larose Ellison, for evaluation for hirsutism (?PCOS).  Hirsutism: - noticed this at 40 y/o - 2 mo after menarche - only chin, sideburns - used to shave it >> stopped; then started to use Nair cream; tried electrolysis as a teenager - no FH of hirsutism in mother and sister  Acne: - no  Fertility/Menstrual cycles: - h/o regular menses, but had heavy menses and dysmenorrhea [age] y/o, then improved. Has a h/o fibroids dx 2004 >> surgery 2010. Now: periods every 1-2 month.  - no h/o ovarian cysts - children: 0 - miscarriages: 0 - contraception: nothing - not sexually active currently; has been on OCPs - brief course - to shrink the fibroids  - no h/o infertility  Weight gain: - stable weight in last 20 years - no steroid use - no weight loss meds - Diets tried: slim fast in the past - Exercise: no  Treatments tried: - did not try Metformin - did not try Spironolactone - did not try Vaniqa - prev. on OCPs  Of note, pt's adrenal glands appear normal on a contrasted abd. CT from 2009.  - Last thyroid tests: Lab Results  Component Value Date   TSH 2.38 06/28/2015   - Last set of  She has FH of DM in PGM, PGF.   She was dx'ed with HTN in 2014. She also has a h/o anemia.  ROS: Constitutional: no weight gain, no fatigue, no subjective hyperthermia/hypothermia Eyes: no blurry vision, no xerophthalmia ENT: no sore throat, no nodules palpated in throat, no dysphagia/odynophagia, no hoarseness Cardiovascular: no CP/SOB/palpitations/leg swelling Respiratory: no cough/SOB Gastrointestinal: no N/V/D/C Musculoskeletal: no muscle/joint aches Skin: no acne, + hair on face Neurological: no tremors/numbness/tingling/dizziness Psychiatric: no depression/anxiety  Past Medical History:  Diagnosis Date  . Anemia    iron def, used to see hematology for  IV iron  . Fibroids    s/p removal ~ 2010  . Hypertension 03/21/2013  . Learning disability   . Seasonal allergies    Past Surgical History:  Procedure Laterality Date  . MYOMECTOMY  2010   Social History   Social History  . Marital status: Single    Spouse name: N/A  . Number of children: 0   Occupational History  . 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 Smoking status: Never Smoker  . Smokeless tobacco: Never Used  . Alcohol use Yes     Comment: rarely  . Drug use: No  . Sexual activity: No   Social History Narrative   Family from [place] . Lives w/ her mother    [id number]   Current Outpatient Prescriptions on File Prior to Visit  Medication Sig Dispense Refill  . metoprolol tartrate (LOPRESSOR) 25 MG tablet Take 1 tablet (25 mg total) by mouth 2 (two) times daily. 60 tablet 5   No current facility-administered medications on file prior to visit.    No Known Allergies Family History  Problem Relation Age of Onset  . Kidney disease Father     End sate  . Coronary artery disease Father   . Diabetes      grandmother  . Heart disease Paternal Aunt   . Cancer Paternal Aunt   . Diabetes Maternal Uncle   . Healthy Mother    PE:  BP 135/84   Pulse 72   Wt 238 lb (108 kg)   BMI 43.53 kg/m  Wt Readings from Last 3 Encounters:  08/23/16 238 lb (108 kg)  06/26/16 237 lb 6 oz (107.7 kg)  06/28/15 233 lb (105.7 kg)   Constitutional: Obese, in NAD, no full supraclavicular fat pads Eyes: PERRLA, EOMI, no exophthalmos ENT: moist mucous membranes, no thyromegaly, no cervical lymphadenopathy Cardiovascular: RRR, No MRG Respiratory: CTA B Gastrointestinal: abdomen soft, NT, ND, BS+ Musculoskeletal: no deformities, strength intact in all 4 Skin: moist, warm; No acne on face, + dark terminal hair on chin and sideburns, no skin tags, + acanthosis nigricans, no purple, wide, stretch marks Neurological: no tremor with  outstretched hands, DTR normal in all 4  ASSESSMENT: 1. 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 Hirsutism - Only on face - We discussed about the fact that this could be genetic (although her mother and her sister do not have particularly increased hair on face), a can also be related to obesity (this is actually her question and she is right), adrenal problems (tumors, NCCAH, Cushing syndrome - a CT scan in 2009 did not show adrenal tumors and also the long-standing nature of the problem would suggest otherwise), PCOS. - At today's visit, we can check for adrenal disease (I have low suspicion for this due to long-standing disease) and also for PCOS. I had a long discussion with the patient about the fact that the PCOS is a misnomer, a patient does not necessarily have to have polycystic ovaries to be diagnosed with the disorder. This is of sum of several conditions, including:  weight gain  insulin resistance (and therefore a higher risk of developing diabetes later in life)  acne  hirsutism  irregular menstrual cycles  decreased fertility. - We also discussed about the fact that the treatment is usually targeted to addressing the problem that concerns the patient the most: acne/hirsutism, weight gain, or fertility, but there is no single treatment for PCOS. For her, hirsutism is definitely the most bothersome problem. - We will check the following labs today (LMP in November): Orders Placed This Encounter  Procedures  . 17-Hydroxyprogesterone  . Androstenedione  . Estradiol  . Prolactin  . Luteinizing hormone  . Follicle stimulating hormone  . TSH  . T4, free  . T3, free  . Testosterone, Free, Total, SHBG  . DHEA-sulfate  - If she turns out to have PCOS, we can start spironolactone. In this case, we would start at 25 mg twice a day and advance to 50 mg twice a day 3 days. I will then have the patient come back for labs and a blood pressure check in 7 days. - If she does not have PCOS,  she can still try the Vaniqa cream, however, I advised that she needs to use it for a long period of time and he can get expensive. Also, if she does not have PCOS, I suggested that she sees dermatology for other possible suggestions regarding hirsutism treatment.  Component     Latest Ref Rng & Units 08/23/2016  Testosterone     8 - 48 ng/dL 9  Testosterone Free     0.0 - 4.2 pg/mL 0.8  Sex Horm Binding Glob, Serum     24.6 - 122.0 nmol/L 29.6  Estradiol, Free     pg/mL 1.84  Estradiol     pg/mL 86  TSH     0.35 - 4.50 uIU/mL 2.52  17-OH-Progesterone, LC/MS/MS  ng/dL 14  Androstenedione     ng/dL 41  Prolactin     ng/mL 6.4  LH     mIU/mL 26.40  FSH     mIU/ML 27.4  T4,Free(Direct)     0.60 - 1.60 ng/dL 0.80  Triiodothyronine,Free,Serum     2.3 - 4.2 pg/mL 3.3  DHEA-SO4     19 - 231 ug/dL 151   All labs are normal. No signs of PCOS, thyroid disease, hyperprolactinemia, [place] CAH.  Philemon Kingdom, MD PhD [facility] Endocrinology

## 2016-08-23 NOTE — Patient Instructions (Signed)
Please stop at the lab.  If the labs point towards PCOS >> we can start Spironolactone.   You can also try Vaniqua cream.  We will schedule a new appt if labs are abnormal.

## 2016-08-24 LAB — TESTOSTERONE, FREE, TOTAL, SHBG
SEX HORMONE BINDING: 29.6 nmol/L (ref 24.6–122.0)
TESTOSTERONE FREE: 0.8 pg/mL (ref 0.0–4.2)
Testosterone: 9 ng/dL (ref 8–48)

## 2016-08-24 LAB — DHEA-SULFATE: DHEA SO4: 151 ug/dL (ref 19–231)

## 2016-08-24 LAB — PROLACTIN: PROLACTIN: 6.4 ng/mL

## 2016-08-26 LAB — ANDROSTENEDIONE: ANDROSTENEDIONE: 41 ng/dL

## 2016-08-26 LAB — 17-HYDROXYPROGESTERONE: 17-OH-Progesterone, LC/MS/MS: 14 ng/dL

## 2016-08-30 ENCOUNTER — Telehealth: Payer: Self-pay

## 2016-08-30 ENCOUNTER — Telehealth: Payer: Self-pay | Admitting: Internal Medicine

## 2016-08-30 LAB — ESTRADIOL, FREE
ESTRADIOL FREE: 1.84 pg/mL
Estradiol: 86 pg/mL

## 2016-08-30 NOTE — Telephone Encounter (Signed)
Pt called in to check the status of her lab results.

## 2016-08-30 NOTE — Telephone Encounter (Signed)
Called patient back, left message advising patient that not all lab results were back yet, and Dr.Gherghe has not looked over them yet; but as soon as she does I would give patient a call back with results. I advised if any other questions to call our office. I gave call back number.

## 2016-08-31 ENCOUNTER — Telehealth: Payer: Self-pay

## 2016-08-31 DIAGNOSIS — L680 Hirsutism: Secondary | ICD-10-CM | POA: Insufficient documentation

## 2016-08-31 NOTE — Telephone Encounter (Signed)
-----   Message from Philemon Kingdom, MD sent at 08/31/2016  7:50 AM EST ----- Almyra Free, can you please call pt: All labs are normal. No signs of PCOS, thyroid disease, hyperprolactinemia, [place] CAH. Can you please also send her a letter with her results if she prefers?

## 2016-08-31 NOTE — Telephone Encounter (Signed)
Called and left message for patient, left call back number.

## 2016-09-01 ENCOUNTER — Telehealth: Payer: Self-pay

## 2016-09-01 NOTE — Telephone Encounter (Signed)
Patient is calling for lab results.

## 2016-09-01 NOTE — Telephone Encounter (Signed)
Called and spoke with patient about lab results. No questions at this time. Will send letter to patient house.

## 2016-12-04 ENCOUNTER — Other Ambulatory Visit: Payer: Self-pay | Admitting: Internal Medicine

## 2017-02-22 ENCOUNTER — Other Ambulatory Visit: Payer: Self-pay | Admitting: Internal Medicine

## 2017-03-08 LAB — GLUCOSE, POCT (MANUAL RESULT ENTRY): POC GLUCOSE: 88 mg/dL (ref 70–99)

## 2017-04-20 ENCOUNTER — Other Ambulatory Visit: Payer: Self-pay | Admitting: Internal Medicine

## 2017-05-16 ENCOUNTER — Other Ambulatory Visit: Payer: Self-pay | Admitting: Internal Medicine

## 2017-06-09 ENCOUNTER — Other Ambulatory Visit: Payer: Self-pay | Admitting: Internal Medicine

## 2017-06-30 ENCOUNTER — Other Ambulatory Visit: Payer: Self-pay | Admitting: Internal Medicine

## 2018-03-27 ENCOUNTER — Ambulatory Visit: Payer: Self-pay

## 2018-03-27 NOTE — Telephone Encounter (Signed)
Pt. called to report an elevated BP of 161/84 on 7/30, when checked by the Congregational Nurse.  Reported she does not check her BP regularly, and did not have any other readings to compare to.  Reported she stopped her BP medication approx. in March, but has not checked BP.  Reported she has intermittent headaches. Denied any vision difficulty, chest pain, shortness of breath, or weakness / numbness of extremities.  Stated she has not seen Dr. Larose Kells for over a year.  Appt. given for 04/02/18  @ 3:20 PM.  Care advice given per protocol.  Verb. understanding of instructions.  Knows to call back if she becomes symptomatic, or if BP continues to elevate.  Also, scheduled pt. for a CPE, per her request, in October.  Agrees with plan.   Reason for Disposition . Systolic BP  >= 357 OR Diastolic >= 017  Answer Assessment - Initial Assessment Questions 1. BLOOD PRESSURE: "What is the blood pressure?" "Did you take at least two measurements 5 minutes apart?"     One reading taken by Congregational Nurse: 161/84 on 7/30 2. ONSET: "When did you take your blood pressure?"     See above  3. HOW: "How did you obtain the blood pressure?" (e.g., visiting nurse, automatic home BP monitor)     Digital 4. HISTORY: "Do you have a history of high blood pressure?"     yes 5. MEDICATIONS: "Are you taking any medications for blood pressure?" "Have you missed any doses recently?"     Stopped her blood pressure medication approx. March 6. OTHER SYMPTOMS: "Do you have any symptoms?" (e.g., headache, chest pain, blurred vision, difficulty breathing, weakness)     Intermittent headache; denied chest pain, blurred vision, unilateral extremity weakness/ numbness  7. PREGNANCY: "Is there any chance you are pregnant?" "When was your last menstrual period?"     No; LMP about 3-4 mo. Ago  Protocols used: HIGH BLOOD PRESSURE-A-AH

## 2018-04-02 ENCOUNTER — Ambulatory Visit: Payer: BC Managed Care – PPO | Admitting: Internal Medicine

## 2018-04-02 ENCOUNTER — Telehealth: Payer: Self-pay | Admitting: Internal Medicine

## 2018-04-02 ENCOUNTER — Encounter: Payer: Self-pay | Admitting: Internal Medicine

## 2018-04-02 VITALS — BP 142/84 | HR 74 | Temp 98.4°F | Resp 16 | Ht 62.0 in | Wt 250.1 lb

## 2018-04-02 DIAGNOSIS — I10 Essential (primary) hypertension: Secondary | ICD-10-CM | POA: Diagnosis not present

## 2018-04-02 MED ORDER — METOPROLOL TARTRATE 25 MG PO TABS: 25.0000 mg | ORAL_TABLET | Freq: Two times a day (BID) | ORAL | 6 refills | Status: DC

## 2018-04-02 NOTE — Progress Notes (Signed)
Pre visit review using our clinic review tool, if applicable. No additional management support is needed unless otherwise documented below in the visit note. 

## 2018-04-02 NOTE — Telephone Encounter (Signed)
Copied from [place] [id number]. Topic: Quick Communication - See Telephone Encounter >> Apr 02, 2018 10:36 AM Rutherford Nail, NT wrote: CRM for notification. See Telephone encounter for: 04/02/18. Patient calling and states that she has an appointment today (04/02/18) at 3:20 and was wanting to know if Dr Larose Kells prescribes medication, would she need to start that today or start that tomorrow? Informed patient that this would be something that he would mention at the appointment if a prescription is what he decides to do. Requests sending a message before appointment. Also states that she would like for her weight NOT to be put on any paperwork if possible. Informed her that there may not be anything they can do regarding her weight be printed on the paperwork, but I would inform the staff. Please advise. CB#: [phone]

## 2018-04-02 NOTE — Patient Instructions (Signed)
GO TO THE LAB : Get the blood work     GO TO THE FRONT DESK Schedule your next appointment for a physical exam in 6 months     Check the  blood pressure 2 or 3 times a month   Be sure your blood pressure is between 110/65 and  135/85. If it is consistently higher or lower, let me know

## 2018-04-02 NOTE — Telephone Encounter (Signed)
AVS will print w/ weight unfortunately nothing we can do about this.

## 2018-04-02 NOTE — Progress Notes (Signed)
Subjective:    Patient ID: Robin Ellison, female    DOB: Dec 21, 1975, 42 y.o.   MRN: [id number]  DOS:  04/02/2018 Type of visit - description : Acute, last visit 2017 Interval history: Patient has a history of hypertension, was taking metoprolol until 10-2017 when she ran out.  Denies any side effects. A couple of weeks ago she was at church and a nurse check his blood pressure: 161/84. No other recent BP checks.   Review of Systems  Denies chest pain or difficulty breathing. No lower extremity edema.  No headaches. Past Medical History:  Diagnosis Date  . Anemia    iron def, used to see hematology for IV iron  . Fibroids    s/p removal ~ 2010  . Hypertension 03/21/2013  . Learning disability   . Seasonal allergies     Past Surgical History:  Procedure Laterality Date  . MYOMECTOMY  2010    Social History   Socioeconomic History  . Marital status: Single    Spouse name: Not on file  . Number of children: 0  . Years of education: Not on file  . Highest education level: Not on file  Occupational History  . 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 Financial resource strain: Not on file  . Food insecurity:    Worry: Not on file    Inability: Not on file  . Transportation needs:    Medical: Not on file    Non-medical: Not on file  Tobacco Use  . Smoking status: Never Smoker  . Smokeless tobacco: Never Used  Substance and Sexual Activity  . Alcohol use: Yes    Comment: rare  . Drug use: No  . Sexual activity: Not Currently  Lifestyle  . Physical activity:    Days per week: Not on file    Minutes per session: Not on file  . Stress: Not on file  Relationships  . Social connections:    Talks on phone: Not on file    Gets together: Not on file    Attends religious service: Not on file    Active member of club or organization: Not on file    Attends meetings of clubs or organizations: Not on file   Relationship status: Not on file  . Intimate partner violence:    Fear of current or ex partner: Not on file    Emotionally abused: Not on file    Physically abused: Not on file    Forced sexual activity: Not on file  Other Topics Concern  . Not on file  Social History Narrative   Family from [place] . Lives w/ her mother    G0P0      Allergies as of 04/02/2018   No Known Allergies     Medication List        Accurate as of 04/02/18 11:59 PM. Always use your most recent med list.          BIOTIN PO Take 10,000 mg by mouth daily.   cholecalciferol 1000 units tablet Commonly known as:  VITAMIN D Take 1,000 Units by mouth daily.   metoprolol tartrate 25 MG tablet Commonly known as:  LOPRESSOR Take 1 tablet (25 mg total) by mouth 2 (two) times daily.   TYLENOL 8 HOUR 650 MG CR tablet Generic drug:  acetaminophen Take 650 mg by mouth every 8 (eight) hours as needed for pain.   VITAMIN E PO Take  by mouth.          Objective:   Physical Exam BP (!) 142/84 (BP Location: Right Arm, Patient Position: Sitting, Cuff Size: Small)   Pulse 74   Temp 98.4 F (36.9 C) (Oral)   Resp 16   Ht 5\' 2"  (1.575 m)   Wt 250 lb 2 oz (113.5 kg)   SpO2 96%   BMI 45.75 kg/m  General:   Well developed, NAD, see BMI.  HEENT:  Normocephalic . Face symmetric, atraumatic Lungs:  CTA B Normal respiratory effort, no intercostal retractions, no accessory muscle use. Heart: RRR,  no murmur.  No pretibial edema bilaterally  Skin: Not pale. Not jaundice Neurologic:  alert & oriented X3.  Speech normal, gait appropriate for age and unassisted Psych--  Cognition and judgment appear intact.  Cooperative with normal attention span and concentration.  Behavior appropriate. No anxious or depressed appearing.      Assessment & Plan:   Assessment   HTN Learning disability H/o iron def anemia, used to get  IV iron History fibroids VSD: dx 02-2013, had a murmur, ECHO VSD, LVH    Hirsutism: Saw Endo 07/2016, extensive lab negative.  PLAN HTN: Ran out of metoprolol few months ago, BP was checked one time was 161/84, today is 142/84. Recommend to go back on metoprolol, Rx sent, check a BMP.  Recommend good compliance with low-salt diet and stay active. RTC 6 months CPX

## 2018-04-03 LAB — BASIC METABOLIC PANEL
BUN: 13 mg/dL (ref 6–23)
CALCIUM: 9.5 mg/dL (ref 8.4–10.5)
CO2: 28 mEq/L (ref 19–32)
CREATININE: 0.93 mg/dL (ref 0.40–1.20)
Chloride: 101 mEq/L (ref 96–112)
GFR: 84.56 mL/min (ref >60.00)
GLUCOSE: 99 mg/dL (ref 70–99)
Potassium: 4.2 mEq/L (ref 3.5–5.1)
SODIUM: 136 meq/L (ref 135–145)

## 2018-04-03 NOTE — Assessment & Plan Note (Signed)
HTN: Ran out of metoprolol few months ago, BP was checked one time was 161/84, today is 142/84. Recommend to go back on metoprolol, Rx sent, check a BMP.  Recommend good compliance with low-salt diet and stay active. RTC 6 months CPX

## 2018-05-09 ENCOUNTER — Ambulatory Visit: Payer: Self-pay | Admitting: Internal Medicine

## 2018-05-09 NOTE — Telephone Encounter (Signed)
  Pt called to report sudden onset of severe lower abdomen/pelvic pain. Pain began suddenly at 2:00 am. Pt stated she has slight nausea. Pt stated she took Pepto Bismol and nauzene which pt stated has helped a little. Pt stated that she had this similar pain when she was diagnosed with fibroid tumor which was excised back in 2010. Pt stated that sitting down makes the pain better and standing up or moving around makes the pain worse.  Pt's quality of sounded strained due to the pain.  Care advice given with pt verbalizing understanding. Pt advised to go to the ED for evaluation. Reason for Disposition . [1] SEVERE pain (e.g., excruciating) AND [2] present > 1 hour  Answer Assessment - Initial Assessment Questions 1. LOCATION: "Where does it hurt?"     Pelvic area 2. RADIATION: "Does the pain shoot anywhere else?" (e.g., chest, back)     no 3. ONSET: "When did the pain begin?" (e.g., minutes, hours or days ago)      0200 4. SUDDEN: "Gradual or sudden onset?"     sudden 5. PATTERN "Does the pain come and go, or is it constant?"    - If constant: "Is it getting better, staying the same, or worsening?"      (Note: Constant means the pain never goes away completely; most serious pain is constant and it progresses)     - If intermittent: "How long does it last?" "Do you have pain now?"     (Note: Intermittent means the pain goes away completely between bouts)     constant 6. SEVERITY: "How bad is the pain?"  (e.g., Scale 1-10; mild, moderate, or severe)   - MILD (1-3): doesn't interfere with normal activities, abdomen soft and not tender to touch    - MODERATE (4-7): interferes with normal activities or awakens from sleep, tender to touch    - SEVERE (8-10): excruciating pain, doubled over, unable to do any normal activities      severe 7. RECURRENT SYMPTOM: "Have you ever had this type of abdominal pain before?" If so, ask: "When was the last time?" and "What happened that time?"      Yes 2010  fibroid removal 8. CAUSE: "What do you think is causing the abdominal pain?"     Fibroid 9. RELIEVING/AGGRAVATING FACTORS: "What makes it better or worse?" (e.g., movement, antacids, bowel movement)     Sitting down makes it better - getting up and moving around makes it worse 10. OTHER SYMPTOMS: "Has there been any vomiting, diarrhea, constipation, or urine problems?"      Slight nausea 11. PREGNANCY: "Is there any chance you are pregnant?" "When was your last menstrual period?"       No- LMP  End of August  Protocols used: [place]

## 2018-06-14 ENCOUNTER — Ambulatory Visit (INDEPENDENT_AMBULATORY_CARE_PROVIDER_SITE_OTHER): Payer: BC Managed Care – PPO | Admitting: Internal Medicine

## 2018-06-14 ENCOUNTER — Encounter: Payer: Self-pay | Admitting: Internal Medicine

## 2018-06-14 VITALS — BP 138/84 | HR 84 | Temp 98.1°F | Resp 16 | Ht 62.0 in | Wt 244.2 lb

## 2018-06-14 DIAGNOSIS — Q210 Ventricular septal defect: Secondary | ICD-10-CM

## 2018-06-14 DIAGNOSIS — M7989 Other specified soft tissue disorders: Secondary | ICD-10-CM

## 2018-06-14 DIAGNOSIS — Z0000 Encounter for general adult medical examination without abnormal findings: Secondary | ICD-10-CM | POA: Diagnosis not present

## 2018-06-14 DIAGNOSIS — Z01419 Encounter for gynecological examination (general) (routine) without abnormal findings: Secondary | ICD-10-CM

## 2018-06-14 DIAGNOSIS — D509 Iron deficiency anemia, unspecified: Secondary | ICD-10-CM | POA: Diagnosis not present

## 2018-06-14 DIAGNOSIS — Z1231 Encounter for screening mammogram for malignant neoplasm of breast: Secondary | ICD-10-CM

## 2018-06-14 LAB — CBC WITH DIFFERENTIAL/PLATELET
BASOS PCT: 0.6 % (ref 0.0–3.0)
Basophils Absolute: 0 10*3/uL (ref 0.0–0.1)
EOS PCT: 3.2 % (ref 0.0–5.0)
Eosinophils Absolute: 0.2 10*3/uL (ref 0.0–0.7)
HEMATOCRIT: 37 % (ref 36.0–46.0)
HEMOGLOBIN: 11.8 g/dL — AB (ref 12.0–15.0)
LYMPHS PCT: 46 % (ref 12.0–46.0)
Lymphs Abs: 2.4 10*3/uL (ref 0.7–4.0)
MCHC: 31.9 g/dL (ref 30.0–36.0)
MCV: 79 fl (ref 78.0–100.0)
MONOS PCT: 6.6 % (ref 3.0–12.0)
Monocytes Absolute: 0.3 10*3/uL (ref 0.1–1.0)
Neutro Abs: 2.3 10*3/uL (ref 1.4–7.7)
Neutrophils Relative %: 43.6 % (ref 43.0–77.0)
Platelets: 329 10*3/uL (ref 150.0–400.0)
RBC: 4.69 Mil/uL (ref 3.87–5.11)
RDW: 17 % — AB (ref 11.5–15.5)
WBC: 5.2 10*3/uL (ref 4.0–10.5)

## 2018-06-14 LAB — AST: AST: 14 U/L (ref 0–37)

## 2018-06-14 LAB — FERRITIN: Ferritin: 25.8 ng/mL (ref 10.0–291.0)

## 2018-06-14 LAB — LIPID PANEL
CHOLESTEROL: 177 mg/dL (ref 0–200)
HDL: 49.5 mg/dL (ref >39.00)
LDL Cholesterol: 113 mg/dL — ABNORMAL HIGH (ref 0–99)
NonHDL: 127.7
Total CHOL/HDL Ratio: 4
Triglycerides: 73 mg/dL (ref 0.0–149.0)
VLDL: 14.6 mg/dL (ref 0.0–40.0)

## 2018-06-14 LAB — TSH: TSH: 2.01 u[IU]/mL (ref 0.35–4.50)

## 2018-06-14 LAB — IRON: Iron: 60 ug/dL (ref 42–145)

## 2018-06-14 LAB — ALT: ALT: 12 U/L (ref 0–35)

## 2018-06-14 NOTE — Patient Instructions (Addendum)
GO TO THE LAB : Get the blood work     GO TO THE FRONT DESK Schedule your next appointment for a checkup in 6 months  Intertrigo: Rash under the breasts Keep the area dry with powder If it is irritated, use OTC cream called Naftin.

## 2018-06-14 NOTE — Progress Notes (Signed)
Subjective:    Patient ID: Robin Ellison, female    DOB: November 27, 1975, 42 y.o.   MRN: [id number]  DOS:  06/14/2018 Type of visit - description : cpx Interval history: Here for CPX.   Review of Systems Has mostly skin concerns: Rash under the breast on and off, skin tags around the right eye on and off, occasionally the skin on the upper back gets irritated.  She cannot see it but has to scratch.  Other than above, a 14 point review of systems is negative      Past Medical History:  Diagnosis Date  . Anemia    iron def, used to see hematology for IV iron  . Fibroids    s/p removal ~ 2010  . Hypertension 03/21/2013  . Learning disability   . Seasonal allergies     Past Surgical History:  Procedure Laterality Date  . MYOMECTOMY  2010    Social History   Socioeconomic History  . Marital status: Single    Spouse name: Not on file  . Number of children: 0  . Years of education: Not on file  . Highest education level: Not on file  Occupational History  . 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 Financial resource strain: Not on file  . Food insecurity:    Worry: Not on file    Inability: Not on file  . Transportation needs:    Medical: Not on file    Non-medical: Not on file  Tobacco Use  . Smoking status: Never Smoker  . Smokeless tobacco: Never Used  Substance and Sexual Activity  . Alcohol use: Yes    Comment: rare  . Drug use: No  . Sexual activity: Not Currently  Lifestyle  . Physical activity:    Days per week: Not on file    Minutes per session: Not on file  . Stress: Not on file  Relationships  . Social connections:    Talks on phone: Not on file    Gets together: Not on file    Attends religious service: Not on file    Active member of club or organization: Not on file    Attends meetings of clubs or organizations: Not on file    Relationship status: Not on file  . Intimate partner violence:      Fear of current or ex partner: Not on file    Emotionally abused: Not on file    Physically abused: Not on file    Forced sexual activity: Not on file  Other Topics Concern  . Not on file  Social History Narrative   Family from [place] . Lives w/ her mother    G37P0     Family History  Problem Relation Age of Onset  . Kidney disease Father        End sate  . Coronary artery disease Father   . Diabetes Unknown        grandmother  . Heart disease Paternal Aunt   . Cancer Paternal Aunt   . Diabetes Maternal Uncle   . Neuropathy Mother   . Colon cancer Neg Hx   . Breast cancer Neg Hx      Allergies as of 06/14/2018   No Known Allergies     Medication List        Accurate as of 06/14/18 11:59 PM. Always use your most recent med list.  BIOTIN PO Take 10,000 mg by mouth daily.   cholecalciferol 1000 units tablet Commonly known as:  VITAMIN D Take 1,000 Units by mouth daily.   metoprolol tartrate 25 MG tablet Commonly known as:  LOPRESSOR Take 1 tablet (25 mg total) by mouth 2 (two) times daily.   TYLENOL 8 HOUR 650 MG CR tablet Generic drug:  acetaminophen Take 650 mg by mouth every 8 (eight) hours as needed for pain.   VITAMIN E PO Take by mouth.          Objective:   Physical Exam BP 138/84 (BP Location: Left Arm, Patient Position: Sitting, Cuff Size: Normal)   Pulse 84   Temp 98.1 F (36.7 C) (Oral)   Resp 16   Ht 5\' 2"  (1.575 m)   Wt 244 lb 4 oz (110.8 kg)   SpO2 97%   BMI 44.67 kg/m  General: Well developed, NAD, see BMI.  Neck: No  thyromegaly  HEENT:  Normocephalic . Face symmetric, atraumatic Lungs:  CTA B Normal respiratory effort, no intercostal retractions, no accessory muscle use. Heart: RRR,  no murmur.  No pretibial edema bilaterally  Lower extremities: Calves are asymmetric, left one is 1 inch larger, no TTP, no actual pitting edema. Abdomen:  Not distended, soft, non-tender. No rebound or rigidity.   Skin: Has  several  skin tags around the right eye. Has hyperpigmentation of the skin under the breasts Has what seems to be postinflammatory hyperpigmentation areas of the upper back but no active lesions. Neurologic:  alert & oriented X3.  Speech normal, gait appropriate for age and unassisted Strength symmetric and appropriate for age.  Psych: Cognition and judgment appear intact.  Cooperative with normal attention span and concentration.  Behavior appropriate. No anxious or depressed appearing.     Assessment & Plan:   Assessment   HTN Learning disability H/o iron def anemia, used to get  IV iron History fibroids VSD: dx 02-2013, had a murmur, ECHO VSD, LVH  Hirsutism: Saw Endo 07/2016, extensive lab negative.  PLAN HTN: On metoprolol VSD: Murmur present, no symptoms, refer to cardiology for periodic checkups. Intertrigo: Under the breasts, rec  to keep the area clean, dry with powders.  Occasionally use OTC Naftin cream. Skin tags: Offered dermatology referral, declined for now  H/o anemia: rechecking labs noting that period irregular and stopped few months ago Asymmetric calf: Left calf is 1 inch larger in circumference compared to the right.  Unclear etiology, will check a ultrasound RTC 6 months

## 2018-06-14 NOTE — Assessment & Plan Note (Addendum)
Td 2016, had a flu shot. Last mammogram: 2016 Last Pap smear 2016 Recommend to see gynecology -- refer to gyn CCS: screening  not indicated but if has persistent anemia will need GI eval Labs: AST, ALT, FLP, CBC, TSH, iron, ferritin Wt mngmt: [facility]

## 2018-06-14 NOTE — Progress Notes (Signed)
Pre visit review using our clinic review tool, if applicable. No additional management support is needed unless otherwise documented below in the visit note. 

## 2018-06-15 NOTE — Assessment & Plan Note (Signed)
HTN: On metoprolol VSD: Murmur present, no symptoms, refer to cardiology for periodic checkups. Intertrigo: Under the breasts, rec  to keep the area clean, dry with powders.  Occasionally use OTC Naftin cream. Skin tags: Offered dermatology referral, declined for now  H/o anemia: rechecking labs noting that period irregular and stopped few months ago Asymmetric calf: Left calf is 1 inch larger in circumference compared to the right.  Unclear etiology, will check a ultrasound RTC 6 months

## 2018-07-24 ENCOUNTER — Ambulatory Visit (HOSPITAL_BASED_OUTPATIENT_CLINIC_OR_DEPARTMENT_OTHER)
Admission: RE | Admit: 2018-07-24 | Discharge: 2018-07-24 | Disposition: A | Discharge status: Home / Self Care | Payer: BC Managed Care – PPO | Source: Ambulatory Visit | Attending: Internal Medicine | Admitting: Internal Medicine

## 2018-07-24 DIAGNOSIS — M7989 Other specified soft tissue disorders: Secondary | ICD-10-CM | POA: Diagnosis present

## 2018-07-24 DIAGNOSIS — M7122 Synovial cyst of popliteal space [Baker], left knee: Secondary | ICD-10-CM | POA: Diagnosis not present

## 2018-07-30 ENCOUNTER — Telehealth: Payer: Self-pay | Admitting: Internal Medicine

## 2018-07-30 NOTE — Telephone Encounter (Signed)
See telephone encounter for  07/30/2018

## 2018-07-30 NOTE — Telephone Encounter (Signed)
Attempted to call patient at work number as requested.  Was told patient was gone for the day. / Left message on mobile # that her [place] was normal and the results were mailed to her home address

## 2018-07-30 NOTE — Telephone Encounter (Signed)
Copied from [place] [id number]. Topic: Quick Communication - Other Results (Clinic Use ONLY) >> Jul 29, 2018  9:48 AM Lennox Solders wrote: Pt is calling and needs leg xray results >> Jul 29, 2018  9:51 AM Damita Dunnings, CMA wrote: Notes recorded by Damita Dunnings, CMA on 07/24/2018 at 2:49 PM EST Results mailed to Pt. ------  Notes recorded by Colon Branch, MD on 07/24/2018 at 2:43 PM EST Send a letter The ultrasound showed no clot on your left leg. 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 If you have those symptoms, please let me know, I could refer you to a orthopedic doctor

## 2018-07-30 NOTE — Telephone Encounter (Signed)
PT calling back.  States she can be reached at work [phone] just ask to be transferred to pt.

## 2018-07-30 NOTE — Telephone Encounter (Signed)
Tried calling Pt- no answer- unable to leave message. [place] normal- results have been mailed. Okay for PEC to discuss.

## 2018-09-16 ENCOUNTER — Other Ambulatory Visit: Payer: Self-pay | Admitting: Internal Medicine

## 2018-11-12 ENCOUNTER — Other Ambulatory Visit: Payer: Self-pay | Admitting: Internal Medicine

## 2018-11-25 ENCOUNTER — Other Ambulatory Visit: Payer: Self-pay | Admitting: Internal Medicine

## 2018-12-10 ENCOUNTER — Telehealth: Payer: Self-pay

## 2018-12-10 NOTE — Telephone Encounter (Signed)
Due for visit- virtual visit? Virtual visit scheduled.

## 2018-12-11 ENCOUNTER — Other Ambulatory Visit: Payer: Self-pay

## 2018-12-11 ENCOUNTER — Ambulatory Visit (INDEPENDENT_AMBULATORY_CARE_PROVIDER_SITE_OTHER): Payer: BC Managed Care – PPO | Admitting: Internal Medicine

## 2018-12-11 DIAGNOSIS — J302 Other seasonal allergic rhinitis: Secondary | ICD-10-CM

## 2018-12-11 DIAGNOSIS — Q210 Ventricular septal defect: Secondary | ICD-10-CM

## 2018-12-11 DIAGNOSIS — I10 Essential (primary) hypertension: Secondary | ICD-10-CM

## 2018-12-11 DIAGNOSIS — D509 Iron deficiency anemia, unspecified: Secondary | ICD-10-CM

## 2018-12-11 NOTE — Assessment & Plan Note (Signed)
HTN: On metoprolol, no ambulatory BPs. VSD: Failed cardiology referral, will try to refer again on RTC ~05-2019.  Currently clinics are closed. History of anemia: Last hemoglobin stable. History of fibroids, now with irregular periods, perimenopausal?.  Failed gynecology referral, will try again 05-2019. Allergies: Encouraged to use the medication she already has, Flonase and OTC antihistaminic. Coronavirus education: Strongly encouraged to continue practicing social distancing, mask etc.  She has no symptoms currently. RTC 05-2019, CPX, patient will call and make an appointment

## 2018-12-11 NOTE — Progress Notes (Signed)
Subjective:    Patient ID: Robin Ellison, female    DOB: 04-Nov-1975, 43 y.o.   MRN: [id number]  DOS:  12/11/2018 Type of visit - description: Virtual Visit via Video Note  I connected with@ on 12/11/18 at  9:00 AM EDT by a video enabled telemedicine application and verified that I am speaking with the correct person using two identifiers.   THIS ENCOUNTER IS A VIRTUAL VISIT DUE TO COVID-19 - PATIENT WAS NOT SEEN IN THE OFFICE. PATIENT HAS CONSENTED TO VIRTUAL VISIT / TELEMEDICINE VISIT   Location of patient: home  Location of provider: office  I discussed the limitations of evaluation and management by telemedicine and the availability of in person appointments. The patient expressed understanding and agreed to proceed.  History of Present Illness: F/u  Since the last office visit, she is doing well. We review her medication list and recent labs. Her concern today is allergies characterized by sneezing, currently not taking any medication regularly. She is trying to follow the coronavirus prevention guidelines the best she can   Review of Systems Denies fever chills No chest pain no difficulty breathing No cough   Past Medical History:  Diagnosis Date  . Anemia    iron def, used to see hematology for IV iron  . Fibroids    s/p removal ~ 2010  . Hypertension 03/21/2013  . Learning disability   . Seasonal allergies     Past Surgical History:  Procedure Laterality Date  . MYOMECTOMY  2010    Social History   Socioeconomic History  . Marital status: Single    Spouse name: Not on file  . Number of children: 0  . Years of education: Not on file  . Highest education level: Not on file  Occupational History  . 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 Financial resource strain: Not on file  . Food insecurity:    Worry: Not on file    Inability: Not on file  . Transportation needs:    Medical: Not on file   Non-medical: Not on file  Tobacco Use  . Smoking status: Never Smoker  . Smokeless tobacco: Never Used  Substance and Sexual Activity  . Alcohol use: Yes    Comment: rare  . Drug use: No  . Sexual activity: Not Currently  Lifestyle  . Physical activity:    Days per week: Not on file    Minutes per session: Not on file  . Stress: Not on file  Relationships  . Social connections:    Talks on phone: Not on file    Gets together: Not on file    Attends religious service: Not on file    Active member of club or organization: Not on file    Attends meetings of clubs or organizations: Not on file    Relationship status: Not on file  . Intimate partner violence:    Fear of current or ex partner: Not on file    Emotionally abused: Not on file    Physically abused: Not on file    Forced sexual activity: Not on file  Other Topics Concern  . Not on file  Social History Narrative   Family from [place] . Lives w/ her mother    G0P0      Allergies as of 12/11/2018   No Known Allergies     Medication List       Accurate as of December 11, 2018  9:01 AM. Always use your most recent med list.        BIOTIN PO Take 10,000 mg by mouth daily.   cholecalciferol 1000 units tablet Commonly known as:  VITAMIN D Take 1,000 Units by mouth daily.   metoprolol tartrate 25 MG tablet Commonly known as:  LOPRESSOR Take 1 tablet (25 mg total) by mouth 2 (two) times daily.   Tylenol 8 Hour 650 MG CR tablet Generic drug:  acetaminophen Take 650 mg by mouth every 8 (eight) hours as needed for pain.   VITAMIN E PO Take by mouth.           Objective:   Physical Exam There were no vitals taken for this visit. This is a video virtual visit, she is alert oriented x3 in no distress    Assessment     Assessment   HTN Learning disability H/o iron def anemia, used to get  IV iron History fibroids VSD: dx 02-2013, had a murmur, ECHO VSD, LVH  Hirsutism: Saw Endo 07/2016, extensive lab  negative. Asymmetric: Negative [place] for DVT 06-2018  PLAN HTN: On metoprolol, no ambulatory BPs. VSD: Failed cardiology referral, will try to refer again on RTC ~05-2019.  Currently clinics are closed. History of anemia: Last hemoglobin stable. History of fibroids, now with irregular periods, perimenopausal?.  Failed gynecology referral, will try again 05-2019. Allergies: Encouraged to use the medication she already has, Flonase and OTC antihistaminic. Coronavirus education: Strongly encouraged to continue practicing social distancing, mask etc.  She has no symptoms currently. RTC 05-2019, CPX, patient will call and make an appointment

## 2018-12-20 ENCOUNTER — Telehealth: Payer: Self-pay

## 2018-12-20 NOTE — Telephone Encounter (Signed)
Spoke w/ Pt- informed Pt of PCP recommendations. Pt verbalized understanding.

## 2018-12-20 NOTE — Telephone Encounter (Signed)
If she was formally diagnosed with COVID-19 she could be.

## 2018-12-20 NOTE — Telephone Encounter (Signed)
Copied from [place] [phone]. Topic: General - Other >> Dec 18, 2018  5:26 PM Percell Belt A wrote: Reason for CRM: pt called in and stated that she would like to ask Dr Larose Kells if he thinks she would be a good candidate to dotate Plasma ?    Best number -[phone]

## 2018-12-25 ENCOUNTER — Telehealth: Payer: Self-pay | Admitting: Internal Medicine

## 2018-12-26 ENCOUNTER — Other Ambulatory Visit: Payer: Self-pay

## 2018-12-26 MED ORDER — METOPROLOL TARTRATE 25 MG PO TABS: 25.0000 mg | ORAL_TABLET | Freq: Two times a day (BID) | ORAL | 8 refills | Status: DC

## 2018-12-26 NOTE — Telephone Encounter (Signed)
Patient calling and states that the reason that she requested this refill was because the last supply that she pick up, she cannot find. States that it has gone missing. Would like to know what needs to be done at this time for her to be able to get the medication? Please advise.  CB#:[phone]

## 2018-12-26 NOTE — Telephone Encounter (Signed)
The only prescribed medication she takes is metoprolol, if that is  was she is requesting, okay to do for 8 months

## 2019-03-20 ENCOUNTER — Other Ambulatory Visit: Payer: Self-pay | Admitting: Internal Medicine

## 2019-03-22 ENCOUNTER — Encounter: Payer: Self-pay | Admitting: Internal Medicine

## 2019-08-11 ENCOUNTER — Telehealth: Payer: Self-pay | Admitting: *Deleted

## 2019-08-11 NOTE — Telephone Encounter (Signed)
Who Is Calling Patient / Member / Family / Caregiver Call Type Triage / Clinical Relationship To Patient Self Return Phone Number [phone] (Primary) Chief Complaint Vision - (non urgent symptoms) Reason for Call Symptomatic / Request for Health Information Initial Comment Caller states that she is having issues with her eyes, she has had bug bites, and 2 recent falls. When she fell, she knocked herself out. The last time she fell was 1 month ago, and also in October. Her eyes are irritated and it is harder to see things. Translation No Nurse Assessment Nurse: Martyn Ehrich, RN, [place] Date/Time (Eastern Time): 08/10/2019 9:32:07 PM Confirm and document reason for call. If symptomatic, describe symptoms. ---PT is having vision issues - she couldnt see that started today before noon. It started with one eye R then the other eye was affected. She fell and was knocked out on Nov 13

## 2019-08-11 NOTE — Telephone Encounter (Signed)
Left message on machine for patient to call back to schedule appointment  or to contact eye doctor if she has one.  I do not see where she went to an ED to be seen.

## 2020-01-12 ENCOUNTER — Other Ambulatory Visit: Payer: Self-pay | Admitting: Internal Medicine

## 2020-01-26 ENCOUNTER — Other Ambulatory Visit: Payer: Self-pay | Admitting: Internal Medicine

## 2020-02-13 IMAGING — US US EXTREM LOW VENOUS*L*
1 series · 13 of 24 positions shown · non-contrast
Comparison: None.

CLINICAL DATA: 42-year-old female with a history of left lower
extremity swelling



[Series 1: us extrem low venous*left* · 0.12mm/px · 13 of 39 slices shown]
[im 1/39]
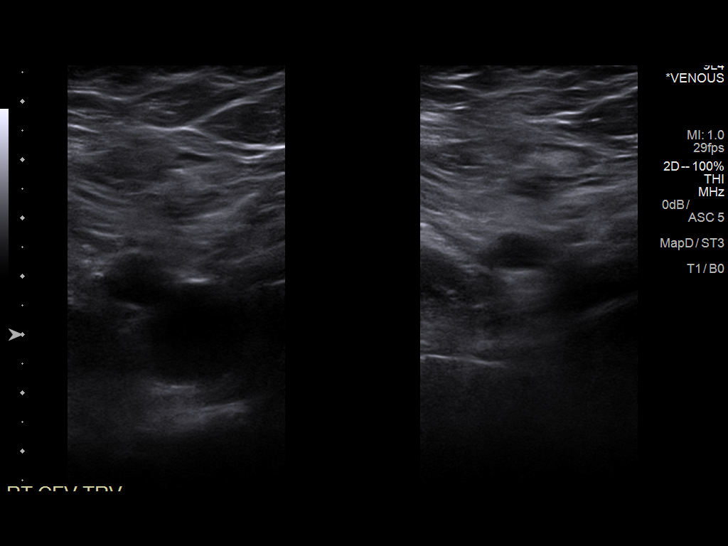
[im 4/39]
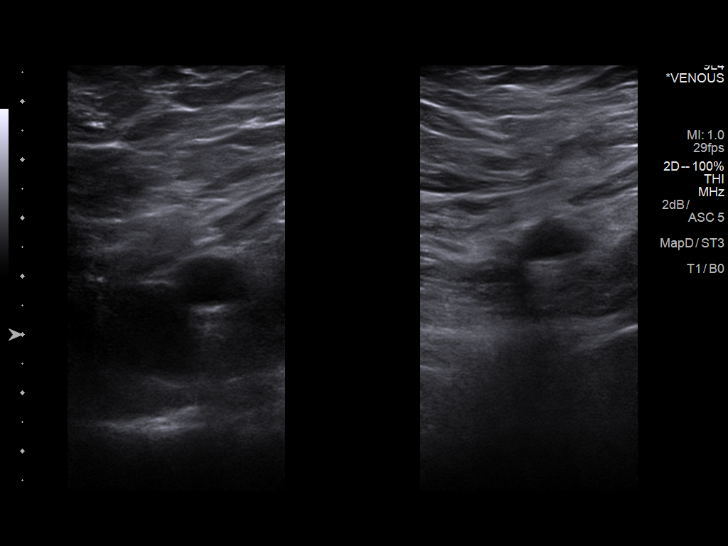
[im 7/39]
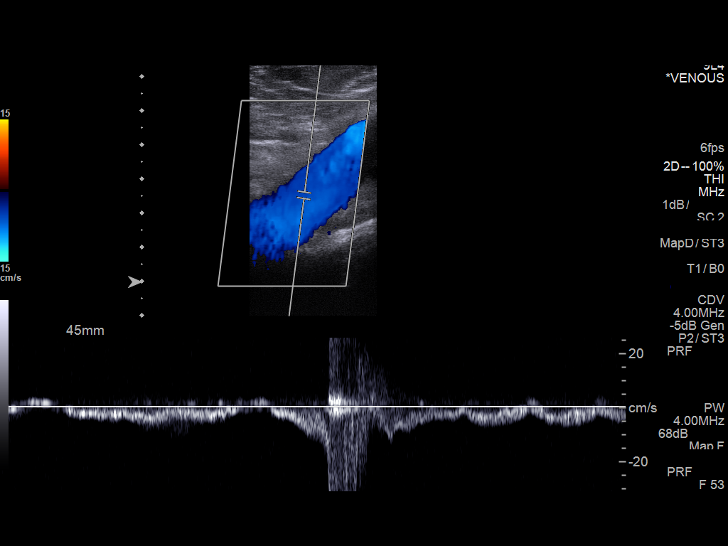
[im 10/39]
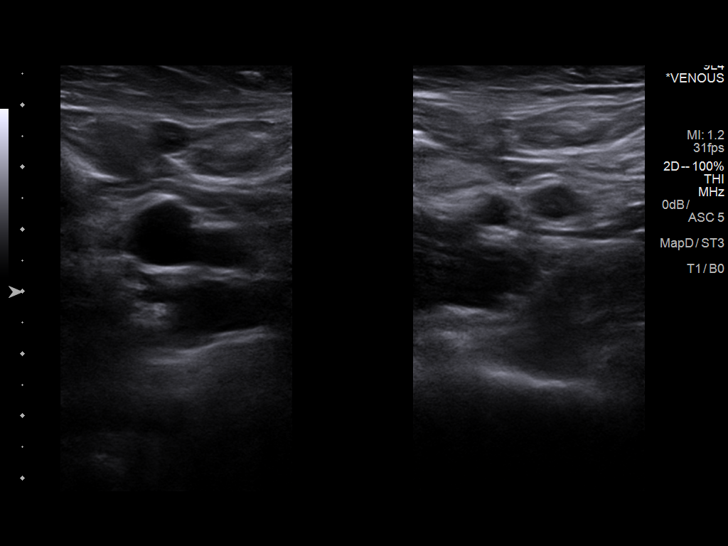
[im 14/39]
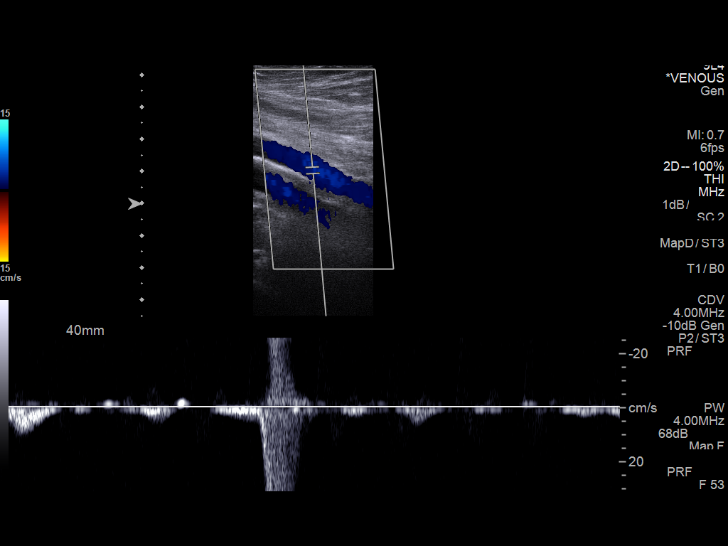
[im 17/39]
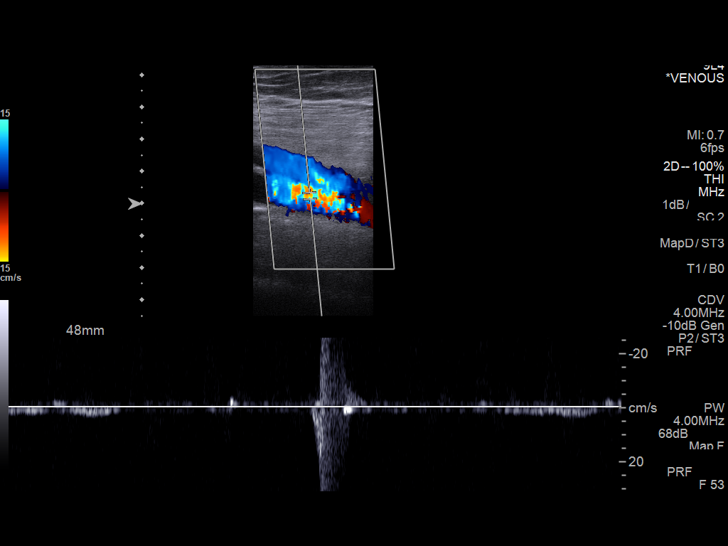
[im 20/39]
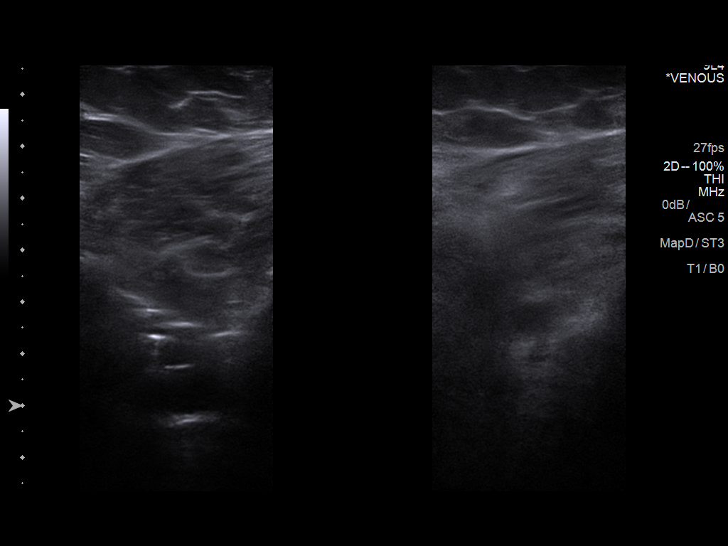
[im 22/39]
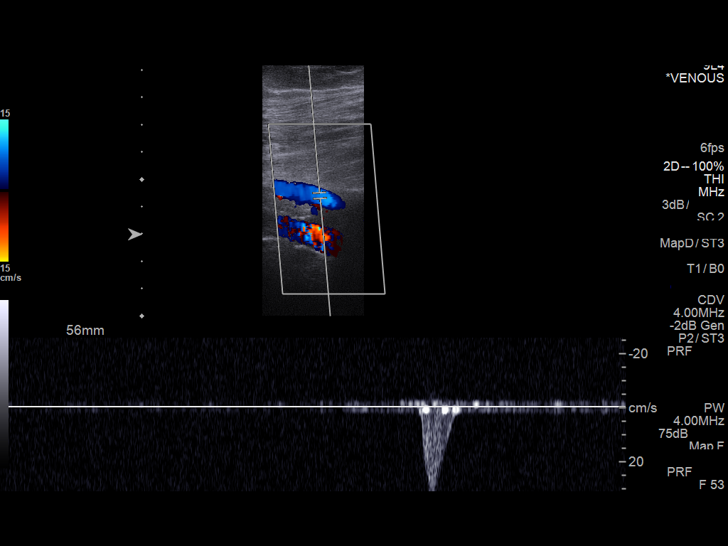
[im 25/39]
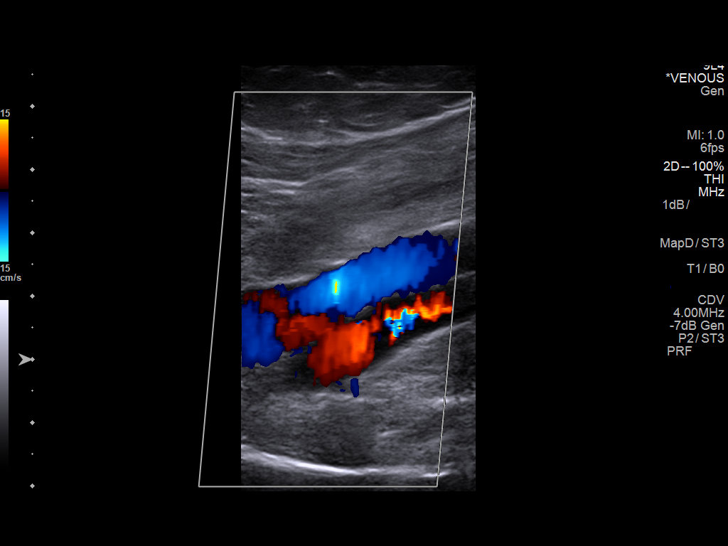
[im 29/39]
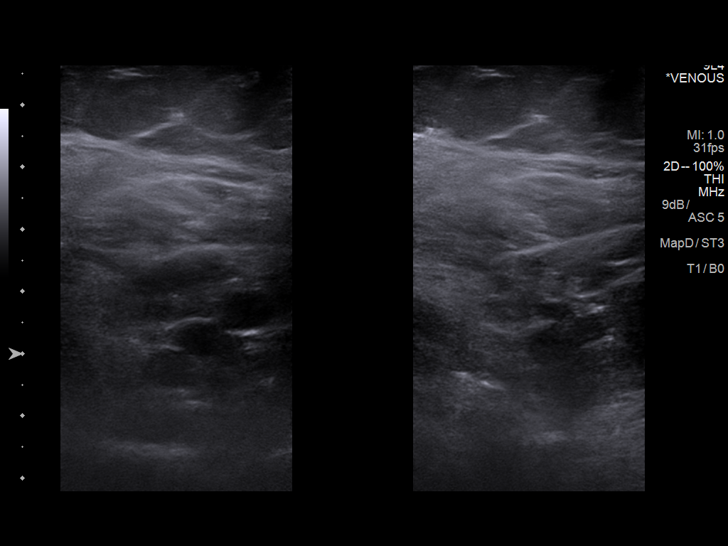
[im 32/39]
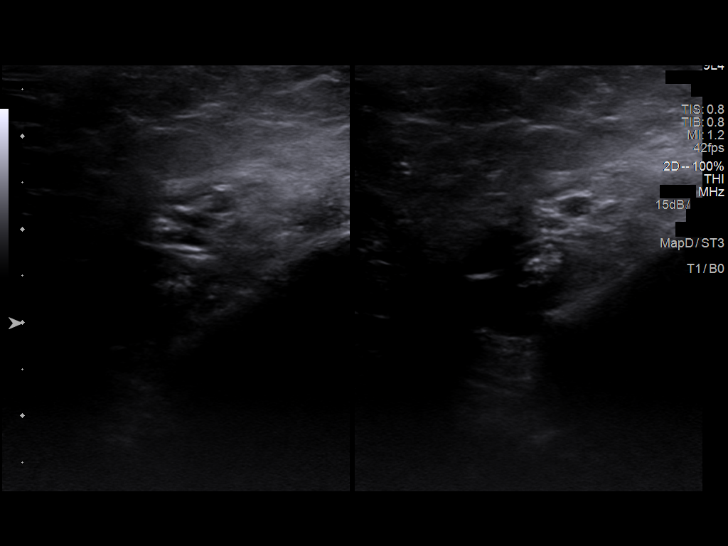
[im 35/39]
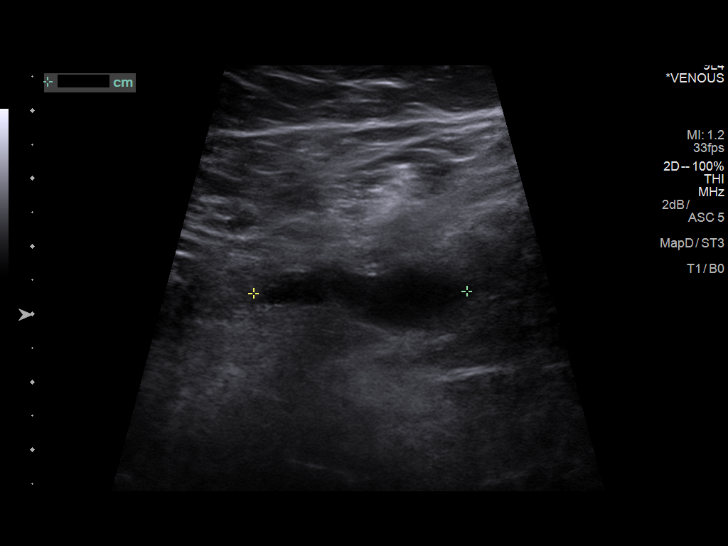
[im 39/39]
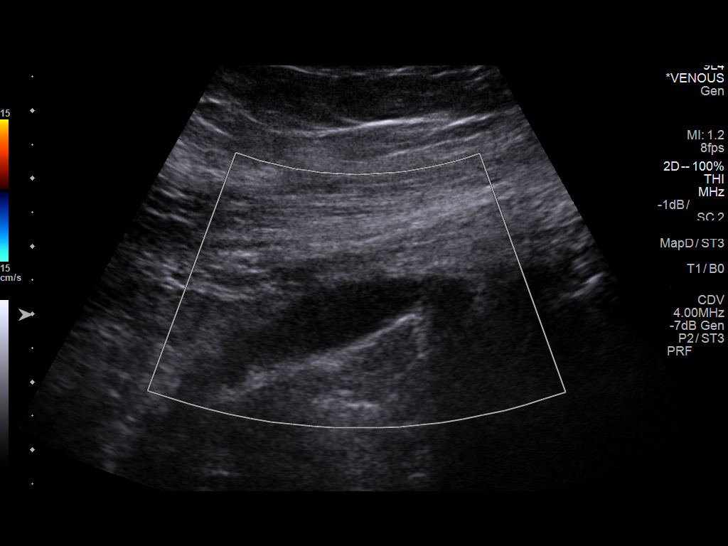

[13 of 24 positions shown; findings below may reference images not displayed]

FINDINGS: Contralateral Common Femoral Vein: Respiratory phasicity is normal
and symmetric with the symptomatic side. No evidence of thrombus.
Normal compressibility.

Common Femoral Vein: No evidence of thrombus. Normal
compressibility, respiratory phasicity and response to augmentation.

Saphenofemoral Junction: No evidence of thrombus. Normal
compressibility and flow on color Doppler imaging.

Profunda Femoral Vein: No evidence of thrombus. Normal
compressibility and flow on color Doppler imaging.

Femoral Vein: No evidence of thrombus. Normal compressibility,
respiratory phasicity and response to augmentation.

Popliteal Vein: No evidence of thrombus. Normal compressibility,
respiratory phasicity and response to augmentation.

Calf Veins: No evidence of thrombus. Normal compressibility and flow
on color Doppler imaging.

Superficial Great Saphenous Vein: No evidence of thrombus. Normal
compressibility and flow on color Doppler imaging.

Other Findings: Anechoic focus in the popliteal region of the left
knee measures 3.0 cm x 1.0 cm x 3.1 cm
IMPRESSION: Sonographic survey of the left lower extremity negative for DVT

Left Baker's cyst

## 2020-06-14 ENCOUNTER — Other Ambulatory Visit: Payer: Self-pay | Admitting: Internal Medicine

## 2020-08-08 ENCOUNTER — Encounter: Payer: Self-pay | Admitting: *Deleted

## 2020-08-08 NOTE — Congregational Nurse Program (Signed)
°  Dept: [phone]   Congregational Nurse Program Note  Date of Encounter: 08/08/2020  Past Medical History: Past Medical History:  Diagnosis Date   Anemia    iron def, used to see hematology for IV iron   Fibroids    s/p removal ~ 2010   Hypertension 03/21/2013   Learning disability    Seasonal allergies     Encounter Details:  CNP Questionnaire - 08/08/20 1302      Questionnaire   Do you give verbal consent to treat you today? Yes    Visit Setting Church or Organization    Location Patient Served At [facility]    Patient Status Not Applicable    Medical Provider Yes    Insurance Private Insurance    Intervention Assess (including screenings)    ED Visit Averted Yes          Client at [facility] today and requested BP check.  Client reports that she is on one BP medication at home and that she has not been to see primary care MD for a while.  Client reports headaches at time.  This CN encouraged client to make appointment with primary care, to continue to take BP medication as ordered and to follow up with me at [facility] as needed.  Karene Fry, RN, MSN, [place] Office [phone] Cell

## 2020-11-04 ENCOUNTER — Encounter: Payer: Self-pay | Admitting: Internal Medicine

## 2021-03-06 ENCOUNTER — Other Ambulatory Visit: Payer: Self-pay

## 2021-03-06 ENCOUNTER — Encounter (HOSPITAL_BASED_OUTPATIENT_CLINIC_OR_DEPARTMENT_OTHER): Payer: Self-pay

## 2021-03-06 ENCOUNTER — Emergency Department (HOSPITAL_BASED_OUTPATIENT_CLINIC_OR_DEPARTMENT_OTHER): Payer: BC Managed Care – PPO

## 2021-03-06 ENCOUNTER — Emergency Department (HOSPITAL_BASED_OUTPATIENT_CLINIC_OR_DEPARTMENT_OTHER)
Admission: EM | Admit: 2021-03-06 | Discharge: 2021-03-06 | Disposition: A | Discharge status: Home / Self Care | Payer: BC Managed Care – PPO | Attending: Emergency Medicine | Admitting: Emergency Medicine

## 2021-03-06 DIAGNOSIS — U071 COVID-19: Secondary | ICD-10-CM | POA: Diagnosis not present

## 2021-03-06 DIAGNOSIS — R55 Syncope and collapse: Secondary | ICD-10-CM

## 2021-03-06 DIAGNOSIS — I10 Essential (primary) hypertension: Secondary | ICD-10-CM | POA: Insufficient documentation

## 2021-03-06 DIAGNOSIS — Z79899 Other long term (current) drug therapy: Secondary | ICD-10-CM | POA: Insufficient documentation

## 2021-03-06 LAB — BASIC METABOLIC PANEL
Anion gap: 9 (ref 5–15)
BUN: 12 mg/dL (ref 6–20)
CO2: 29 mmol/L (ref 22–32)
Calcium: 9 mg/dL (ref 8.9–10.3)
Chloride: 98 mmol/L (ref 98–111)
Creatinine, Ser: 0.97 mg/dL (ref 0.44–1.00)
GFR, Estimated: >60 mL/min (ref >60)
Glucose, Bld: 160 mg/dL — ABNORMAL HIGH (ref 70–99)
Potassium: 3.6 mmol/L (ref 3.5–5.1)
Sodium: 136 mmol/L (ref 135–145)

## 2021-03-06 LAB — CBC
HCT: 38.7 % (ref 36.0–46.0)
Hemoglobin: 12.3 g/dL (ref 12.0–15.0)
MCH: 25.4 pg — ABNORMAL LOW (ref 26.0–34.0)
MCHC: 31.8 g/dL (ref 30.0–36.0)
MCV: 79.8 fL — ABNORMAL LOW (ref 80.0–100.0)
Platelets: 278 10*3/uL (ref 150–400)
RBC: 4.85 MIL/uL (ref 3.87–5.11)
RDW: 16.2 % — ABNORMAL HIGH (ref 11.5–15.5)
WBC: 4.3 10*3/uL (ref 4.0–10.5)
nRBC: 0 % (ref 0.0–0.2)

## 2021-03-06 LAB — TROPONIN I (HIGH SENSITIVITY): Troponin I (High Sensitivity): 3 ng/L (ref <18)

## 2021-03-06 MED ORDER — SODIUM CHLORIDE 0.9 % IV BOLUS
500.0000 mL | Freq: Once | INTRAVENOUS | Status: AC
  Administered 2021-03-06: 500 mL via INTRAVENOUS

## 2021-03-06 NOTE — ED Notes (Signed)
ED Provider at bedside. 

## 2021-03-06 NOTE — ED Provider Notes (Signed)
[place] EMERGENCY DEPARTMENT Provider Note   CSN: [id number] Arrival date & time: 03/06/21  1932     History Chief Complaint  Patient presents with   Near Syncope    Robin Ellison is a 45 y.o. female.   Near Syncope Pertinent negatives include no chest pain. Patient presents with near syncopal episode.  Was at home.  Began to feel lightheaded.  Little dizzy.  States she fell forward and landed on her elbows.  Did not pass out.  No chest pain.  Feeling somewhat better now.  States has a history of anemia and thinks her hemoglobin could be down again.  No fevers chills.  No shortness of breath.  No palpitations.  Does have COVID.  Positive test on Friday.  Had had some symptoms a couple days before that but also had had 2 weeks of some respiratory type stuff before that.  Is on Paxlovid. states she has not been eating and drinking much.  No blood in the stool or black stool.     Past Medical History:  Diagnosis Date   Anemia    iron def, used to see hematology for IV iron   Fibroids    s/p removal ~ 2010   Hypertension 03/21/2013   Learning disability    Seasonal allergies     Patient Active Problem List   Diagnosis Date Noted   Hirsutism 08/31/2016   PCP NOTES >>> 06/03/2015   Hypertension 03/21/2013   Annual physical exam 01/06/2011   Allergic rhinitis 01/06/2011   OBESITY 04/08/2008   VSD (ventricular septal defect) 04/08/2008   FIBROIDS, UTERUS 10/18/2007   ANEMIA, IRON DEFICIENCY NOS 05/10/2007   LEARNING DISABILITY 05/10/2007    Past Surgical History:  Procedure Laterality Date   MYOMECTOMY  2010     OB History     Gravida  0   Para  0   Term  0   Preterm  0   AB  0   Living  0      SAB  0   IAB  0   Ectopic  0   Multiple  0   Live Births              Family History  Problem Relation Age of Onset   Kidney disease Father        End sate   Coronary artery disease Father    Diabetes Unknown        grandmother    Heart disease Paternal Aunt    Cancer Paternal Aunt    Diabetes Maternal Uncle    Neuropathy Mother    Colon cancer Neg Hx    Breast cancer Neg Hx     Social History   Tobacco Use   Smoking status: Never   Smokeless tobacco: Never  Substance Use Topics   Alcohol use: Yes    Comment: rare   Drug use: No    Home Medications Prior to Admission medications   Medication Sig Start Date End Date Taking? Authorizing Provider  acetaminophen (TYLENOL 8 HOUR) 650 MG CR tablet Take 650 mg by mouth every 8 (eight) hours as needed for pain.    [provider]  BIOTIN PO Take 10,000 mg by mouth daily.     [provider]  cholecalciferol (VITAMIN D) 1000 units tablet Take 1,000 Units by mouth daily.    [provider]  metoprolol tartrate (LOPRESSOR) 25 MG tablet Take 1 tablet (25 mg total) by mouth  2 (two) times daily. 01/12/20   Colon Branch, MD  VITAMIN E PO Take by mouth.    [provider]    Allergies    Patient has no known allergies.  Review of Systems   Review of Systems  Constitutional:  Positive for appetite change and fatigue.  HENT:  Negative for congestion.   Respiratory:  Negative for cough.   Cardiovascular:  Positive for near-syncope. Negative for chest pain.  Gastrointestinal:  Negative for anal bleeding.  Genitourinary:  Negative for flank pain.  Musculoskeletal:  Negative for back pain.  Skin:  Negative for pallor.  Neurological:  Positive for light-headedness.  Psychiatric/Behavioral:  Negative for confusion.    Physical Exam Updated Vital Signs BP 139/90 (BP Location: Left Arm)   Pulse 73   Temp 98.2 F (36.8 C) (Oral)   Resp 17   Ht 5\' 2"  (1.575 m)   Wt 110.7 kg   SpO2 100%   BMI 44.63 kg/m   Physical Exam Vitals and nursing note reviewed.  Constitutional:      Appearance: Normal appearance.  HENT:     Head: Atraumatic.  Eyes:     Pupils: Pupils are equal, round, and reactive to light.  Cardiovascular:      Rate and Rhythm: Normal rate and regular rhythm.  Pulmonary:     Effort: Pulmonary effort is normal.  Abdominal:     Tenderness: There is no abdominal tenderness.  Musculoskeletal:        General: No tenderness.  Skin:    General: Skin is warm.     Capillary Refill: Capillary refill takes less than 2 seconds.  Neurological:     Mental Status: She is alert and oriented to person, place, and time.    ED Results / Procedures / Treatments   Labs (all labs ordered are listed, but only abnormal results are displayed) Labs Reviewed  BASIC METABOLIC PANEL - Abnormal; Notable for the following components:      Result Value   Glucose, Bld 160 (*)    All other components within normal limits  CBC - Abnormal; Notable for the following components:   MCV 79.8 (*)    MCH 25.4 (*)    RDW 16.2 (*)    All other components within normal limits  TROPONIN I (HIGH SENSITIVITY)    EKG None  Radiology DG Chest Portable 1 View  Result Date: 03/06/2021 CLINICAL DATA:  Cough, COVID-19 EXAM: PORTABLE CHEST 1 VIEW COMPARISON:  None. FINDINGS: Increased attenuation towards the lung bases largely attributable to body habitus. No focal consolidative process is seen. No pneumothorax or visible layering pleural effusion. Pulmonary vascularity is normally distributed. Cardiac silhouette is within expected normals for the portable technique. No acute osseous or soft tissue abnormality. Telemetry leads overlie the chest. IMPRESSION: Accounting for body habitus, the lungs are clear. Electronically Signed   By: Lovena Le M.D.   On: 03/06/2021 20:23    Procedures Procedures   Medications Ordered in ED Medications  sodium chloride 0.9 % bolus 500 mL (0 mLs Intravenous Stopped 03/06/21 2104)    ED Course  I have reviewed the triage vital signs and the nursing notes.  Pertinent labs & imaging results that were available during my care of the patient were reviewed by me and considered in my medical decision  making (see chart for details).    MDM Rules/Calculators/A&P  Patient presents with a near syncopal episode.  Felt lightheaded.  Has known COVID infection.  Chest x-ray reassuring.  Blood work reassuring.  Negative troponin.  EKG not consistent with ischemia or arrhythmia.  I doubt this is a pulmonary embolism.  States she is been eating and drinking less due to feeling bad from the [place].  I think this is more likely the cause.  Feels better after fluid bolus.  Already on Paxil bid.  Will discharge home with outpatient follow-up as needed.  Sugar is mildly elevated and can be followed as an outpatient. Final Clinical Impression(s) / ED Diagnoses Final diagnoses:  Near syncope  COVID-19    Rx / DC Orders ED Discharge Orders     None        Davonna Belling, MD 03/06/21 2152

## 2021-03-06 NOTE — ED Triage Notes (Signed)
Pt arrives EMS from home with c/o dizziness and a near syncopal episode, falling on elbows. Reports positive covid test this past Friday at [facility] but sx began earlier in the week. Denies fevers, shob, or body aches.

## 2021-03-15 ENCOUNTER — Encounter: Payer: Self-pay | Admitting: *Deleted

## 2021-03-15 NOTE — Congregational Nurse Program (Signed)
  Dept: [phone]   Congregational Nurse Program Note  Date of Encounter: 03/15/2021  Past Medical History: Past Medical History:  Diagnosis Date   Anemia    iron def, used to see hematology for IV iron   Fibroids    s/p removal ~ 2010   Hypertension 03/21/2013   Learning disability    Seasonal allergies     Encounter Details:  CNP Questionnaire - 03/15/21 1357       Questionnaire   Do you give verbal consent to treat you today? Yes    Visit Setting Church or Organization    Location Patient Served At [facility]    Patient Status Not Applicable    Medical Provider Yes    Insurance Private Insurance    Intervention Assess (including screenings)    ED Visit Averted Yes             Client came into [facility] asking for blood sugar check.  CBG checked and it was 87.  Client ate breakfast at 8 am,nothing since 8 am.  This is a follow up for blood sugar done 2 weeks ago which was elevated.  Client pleased with blood sugar being in a normal range today.  Will follow up with this CN as needed.  Karene Fry, RN, MSN, [place] Office [phone] Cell

## 2021-05-10 ENCOUNTER — Telehealth: Payer: Self-pay

## 2021-05-10 NOTE — Telephone Encounter (Signed)
Pt called stating she has had a cough for a few days.  She is unsure of her BP readings (stated she is prone to having some BP issues).  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  She is wanting to know what she could take for the cough.  I advised pt she has not been seen by PCP since 12/11/2018 and she needs to schedule an appt and it could even be a VV if she cannot come in.  Pt declined both in person and VV while on the phone.  Please advise.

## 2021-05-11 NOTE — Telephone Encounter (Signed)
Agree, needs an appt for advice since we have not seen her in 2 years.

## 2021-05-11 NOTE — Telephone Encounter (Signed)
Noted, thank you

## 2021-05-11 NOTE — Telephone Encounter (Signed)
urse Assessment Nurse: Rolin Barry, RN, Levada Dy Date/Time Eilene Ghazi Time): 05/11/2021 12:53:13 PM Confirm and document reason for call. If symptomatic, describe symptoms. ---Caller states she has a cough and her blood pressure is 132/82. Caller has had the cough a few days. No temp. Caller has not tested for Covid. Does the patient have any new or worsening symptoms? ---Yes Will a triage be completed? ---Yes Related visit to physician within the last 2 weeks? ---No Does the PT have any chronic conditions? (i.e. diabetes, asthma, this includes High risk factors for pregnancy, etc.) ---Yes List chronic conditions. ---HTN Bakers cyst Is the patient pregnant or possibly pregnant? (Ask all females between the ages of 69-55) ---No Is this a behavioral health or substance abuse call? ---No Guidelines Guideline Title Affirmed Question Affirmed Notes Nurse Date/Time (Eastern Time) COVID-19 - Diagnosed or Suspected [1] COVID-19 infection suspected by caller or triager AND [2] mild symptoms (cough, fever, or others) AND [3] has not gotten tested yet Rolin Barry, RN, Levada Dy 05/11/2021 12:57:39 PM PLEASE NOTE: All timestamps contained within this report are represented as [place] Standard Time. CONFIDENTIALTY NOTICE: This fax transmission is intended only for the addressee. It contains information that is legally privileged, confidential or otherwise protected from use or disclosure. If you are not the intended recipient, you are strictly prohibited from reviewing, disclosing, copying using or disseminating any of this information or taking any action in reliance on or regarding this information. If you have received this fax in error, please notify us immediately by telephone so that we can arrange for its return to [place]. Phone: [phone], Toll-Free: [phone], Fax: [phone] Page: 2 of 2 Call Id: [id number] [place]. Time Eilene Ghazi Time) Disposition Final User 05/11/2021 12:50:13 PM Send to Urgent  Queue Peggye Fothergill 05/11/2021 1:01:27 PM Home Care Yes Deaton, RN, Cindee Lame Disagree/Comply Comply Caller Understands Yes PreDisposition Did not know what to do Care Advice Given Per Guideline HOME CARE: * You should be able to treat this at home. COVID-19 - WHO NEEDS TESTING? * SYMPTOMS: All people who have symptoms of COVID-19 should get tested WITHIN 3 DAYS of becoming ill. COVID-19 - SOME OTHER FACTS: * INCUBATION PERIOD: Average 5 days (range 2 to 14 days) after coming in contact with a person who has COVID-19 virus. * Feeling dehydrated: Drink extra liquids. If the air in your home is dry, use a humidifier. * Cough syrups containing the cough suppressant dextromethorphan may help decrease your cough. COUGH SYRUP WITH DEXTROMETHORPHAN: COUGH SYRUP WITH DEXTROMETHORPHAN - EXTRA NOTES AND WARNINGS: * Do not try to completely stop coughs that produce mucus and phlegm. * Coughing is helpful. It brings up the mucus from the lungs and helps prevent pneumonia. CALL BACK IF: * Fever over 103 F (39.4 C) * Fever returns after being gone for 24 hours * Fever lasts over 3 days * Chest pain or difficulty breathing occurs * You become worse CARE ADVICE given per COVID-19 - DIAGNOSED OR SUSPECTED (Adult) guideline. Comments User: Saverio Danker, RN Date/Time Eilene Ghazi Time): 05/11/2021 1:04:17 PM Caller advised that she was told to call in and report her B/P reading today. B/P by the nurse was 132/82, per caller, User: Saverio Danker, RN Date/Time Eilene Ghazi Time): 05/11/2021 1:04:49 PM Sent lead PC a message about the call. CC. User: Dalia Heading Date/Time Eilene Ghazi Time): 05/11/2021 1:35:00 PM Per recording, patient says bp is 32/82

## 2021-05-11 NOTE — Telephone Encounter (Signed)
I called and left the pt a detailed message on her voice mail advising an appt would be need since she had not been seen since 2020.

## 2021-12-11 ENCOUNTER — Encounter: Payer: Self-pay | Admitting: *Deleted

## 2021-12-12 NOTE — Congregational Nurse Program (Signed)
?  Dept: [phone] ? ? ?Congregational Nurse Program Note ? ?Date of Encounter: 12/11/2021 ? ?Past Medical History: ?Past Medical History:  ?Diagnosis Date  ? Anemia   ? iron def, used to see hematology for IV iron  ? Fibroids   ? s/p removal ~ 2010  ? Hypertension 03/21/2013  ? Learning disability   ? Seasonal allergies   ? ? ?Encounter Details: ? CNP Questionnaire - 12/11/21 1300   ? ?  ? Questionnaire  ? Do you give verbal consent to treat you today? Yes   ? Location Patient [place]   ? Visit Setting Church or Organization   ? Patient Status Unknown   ? Sport and exercise psychologist or [employer]   ? Insurance Referral N/A   ? Medication N/A   ? Medical Provider Yes   ? Screening Referrals N/A   ? Medical Referral N/A   ? Medical Appointment Made N/A   ? Food N/A   ? Transportation N/A   ? Housing/Utilities N/A   ? Interpersonal Safety N/A   ? Intervention Blood pressure;Blood glucose;Educate   ? ED Visit Averted Yes   ? Life-Saving Intervention Made N/A   ? ?  ?  ? ?  ?Client came into [facility] clinic yesterday after church service.  BP 164/105 HR 97.  Client tells me that she has not taken her BP medication yet today.  Blood glucose fingerstick 93.  Client has soreness on the bottom of her right heel, but no open sore or wound present.  Client says she may have planter fascitis.  Client encouraged to take BP when she gets home and to make PCP appointment as needed for foot pain.  This CN did go over some foot exercises for plantar fascitis that were found on the internet.  Will follow up as needed. ? ?Karene Fry, RN, MSN, CNP ?[phone] Office ?[phone] Cell ? ? ? ? ?

## 2022-02-12 ENCOUNTER — Encounter: Payer: Self-pay | Admitting: *Deleted

## 2022-02-14 NOTE — Congregational Nurse Program (Signed)
  Dept: [phone]   Congregational Nurse Program Note  Date of Encounter: 02/12/2022  Past Medical History: Past Medical History:  Diagnosis Date   Anemia    iron def, used to see hematology for IV iron   Fibroids    s/p removal ~ 2010   Hypertension 03/21/2013   Learning disability    Seasonal allergies     Encounter Details:  CNP Questionnaire - 02/12/22 1220       Questionnaire   Do you give verbal consent to treat you today? Yes    Location Patient [place] or Organization    Patient Status Unknown    Sport and exercise psychologist or [employer] Referral N/A    Medication N/A    Medical Provider Yes    Screening Referrals N/A    Medical Referral N/A    Medical Appointment Made N/A    Food N/A    Transportation N/A    Housing/Utilities N/A    Interpersonal Safety N/A    Intervention Blood pressure;Educate;Support    ED Visit Averted Yes    Life-Saving Intervention Made N/A            Client requested BP check after [employer] service today from this CN.  First BP 149/95 HR 84.  After a couple minutes, BP down to 138/96 and HR 76.  Client feels well today.  Client is perspiring on her face/forehead area.  Client will follow up as desired with MD or with this CN for BP checks. Karene Fry, RN, MSN, [place] Office [phone] Cell

## 2022-04-09 ENCOUNTER — Encounter: Payer: Self-pay | Admitting: *Deleted

## 2022-04-11 NOTE — Congregational Nurse Program (Signed)
  Dept: [phone]   Congregational Nurse Program Note  Date of Encounter: 04/09/2022  Past Medical History: Past Medical History:  Diagnosis Date   Anemia    iron def, used to see hematology for IV iron   Fibroids    s/p removal ~ 2010   Hypertension 03/21/2013   Learning disability    Seasonal allergies     Encounter Details:  CNP Questionnaire - 04/09/22 1245       Questionnaire   Do you give verbal consent to treat you today? Yes    Location Patient [place] or Organization    Patient Status Unknown    Sport and exercise psychologist or [employer] Referral N/A    Medication N/A    Medical Provider Yes    Screening Referrals N/A    Medical Referral N/A    Medical Appointment Made N/A    Food N/A    Transportation N/A    Housing/Utilities N/A    Interpersonal Safety N/A    Intervention Blood pressure;Educate;Support;Blood glucose    ED Visit Averted Yes    Life-Saving Intervention Made N/A            Client came into nurse clinic after church service.  BP 144/94 HR 72 and fingerstick glucose check 91.  Client is on BP medication.  Client has a history of DM in her family.  Client will follow up with this CN as desired.  Karene Fry, RN, MSN, [place] Office [phone] Cell

## 2022-05-14 ENCOUNTER — Encounter: Payer: Self-pay | Admitting: *Deleted

## 2022-05-16 NOTE — Congregational Nurse Program (Signed)
  Dept: [phone]   Congregational Nurse Program Note  Date of Encounter: 05/14/2022  Past Medical History: Past Medical History:  Diagnosis Date   Anemia    iron def, used to see hematology for IV iron   Fibroids    s/p removal ~ 2010   Hypertension 03/21/2013   Learning disability    Seasonal allergies     Encounter Details:  CNP Questionnaire - 05/14/22 1234       Questionnaire   Do you give verbal consent to treat you today? Yes    Location Patient [place] or Organization    Patient Status Unknown    Sport and exercise psychologist or [employer] Referral N/A    Medication N/A    Medical Provider Yes    Screening Referrals N/A    Medical Referral N/A    Medical Appointment Made N/A    Food N/A    Transportation N/A    Housing/Utilities N/A    Interpersonal Safety N/A    Intervention Blood pressure;Educate;Support;Blood glucose    ED Visit Averted Yes    Life-Saving Intervention Made N/A            Client was at nurse clinic after church service.  BP 126/103 HR 98 and fingerstick blood sugar 105.  Client concerned about elevated glucose.  Has not eaten since 0800 breakfast per self report.  Educated client about pre-diabetes and how to keep blood sugar in normal range.  Client encouraged to limit sodas, sweet tea and desserts.  Client will follow up with this CN as desired.  Karene Fry, RN, MSN, [place] Office [phone] Cell

## 2022-07-23 LAB — GLUCOSE, POCT (MANUAL RESULT ENTRY): POC Glucose: 88 mg/dl (ref 70–99)

## 2022-07-24 ENCOUNTER — Encounter: Payer: Self-pay | Admitting: *Deleted

## 2022-07-24 DIAGNOSIS — R7309 Other abnormal glucose: Secondary | ICD-10-CM

## 2022-07-24 NOTE — Congregational Nurse Program (Signed)
  Dept: [phone]   Congregational Nurse Program Note  Date of Encounter: 07/23/2022  Past Medical History: Past Medical History:  Diagnosis Date   Anemia    iron def, used to see hematology for IV iron   Fibroids    s/p removal ~ 2010   Hypertension 03/21/2013   Learning disability    Seasonal allergies     Encounter Details:  CNP Questionnaire - 07/23/22 1045       Questionnaire   Ask client: Do you give verbal consent for me to treat you today? Yes    Student Assistance N/A    Location Patient [place]    Visit Setting with Client Church    Patient Status Unknown    Insurance Unknown    Insurance/Financial Assistance Referral N/A    Medication N/A    Medical Provider Yes    Screening Referrals Made N/A    Medical Referrals Made N/A    Medical Appointment Made N/A    Recently w/o PCP, now 1st time PCP visit completed due to CNs referral or appointment made N/A    Food N/A    Transportation N/A    Housing/Utilities N/A    Interpersonal Safety N/A    Interventions Advocate/Support;Educate    Abnormal to Normal Screening Since Last CN Visit N/A    Screenings CN Performed Blood Pressure;Blood Glucose    Sent Client to Lab for: N/A    Did client attend any of the following based off CNs referral or appointments made? N/A    ED Visit Averted Yes    Life-Saving Intervention Made N/A            client came into church clinic today for BP and glucose check.  BP 159/105 HR 74 and glucose 88 (not fasting - ate breakfast at 0800).  Client had not taken her BP medication today.  Discussed importance of taking BP medication as prescribed.  Will follow up with this CN as desired.  Karene Fry, RN, MSN, [place] Office [phone] Cell

## 2022-08-18 ENCOUNTER — Telehealth: Payer: Self-pay | Admitting: Internal Medicine

## 2022-08-18 NOTE — Telephone Encounter (Signed)
Please advise 

## 2022-08-18 NOTE — Telephone Encounter (Signed)
Please inform Pt of Dr. Ethel Rana decision. Thank you.

## 2022-08-18 NOTE — Telephone Encounter (Signed)
Pt would like to re-establish with Dr.Paz. stated she did not realize it had been so long.

## 2022-08-18 NOTE — Telephone Encounter (Signed)
Unable to re-establish  at this time

## 2022-08-22 NOTE — Telephone Encounter (Signed)
Pt called back and was advised of decision. Pt scheduled NP appt with Jodi Mourning

## 2022-09-01 ENCOUNTER — Encounter: Payer: Self-pay | Admitting: Family

## 2022-09-01 ENCOUNTER — Ambulatory Visit: Payer: BC Managed Care – PPO | Admitting: Family

## 2022-09-01 VITALS — BP 140/78 | HR 110 | Ht 62.0 in | Wt 252.6 lb

## 2022-09-01 DIAGNOSIS — Z124 Encounter for screening for malignant neoplasm of cervix: Secondary | ICD-10-CM | POA: Diagnosis not present

## 2022-09-01 DIAGNOSIS — I10 Essential (primary) hypertension: Secondary | ICD-10-CM | POA: Diagnosis not present

## 2022-09-01 DIAGNOSIS — Z13220 Encounter for screening for lipoid disorders: Secondary | ICD-10-CM

## 2022-09-01 DIAGNOSIS — R7309 Other abnormal glucose: Secondary | ICD-10-CM

## 2022-09-01 DIAGNOSIS — D509 Iron deficiency anemia, unspecified: Secondary | ICD-10-CM

## 2022-09-01 DIAGNOSIS — Z1231 Encounter for screening mammogram for malignant neoplasm of breast: Secondary | ICD-10-CM | POA: Diagnosis not present

## 2022-09-01 DIAGNOSIS — Z1211 Encounter for screening for malignant neoplasm of colon: Secondary | ICD-10-CM

## 2022-09-01 DIAGNOSIS — Z6841 Body Mass Index (BMI) 40.0 and over, adult: Secondary | ICD-10-CM

## 2022-09-01 DIAGNOSIS — Q210 Ventricular septal defect: Secondary | ICD-10-CM

## 2022-09-01 MED ORDER — METOPROLOL TARTRATE 25 MG PO TABS: 25.0000 mg | ORAL_TABLET | Freq: Two times a day (BID) | ORAL | 3 refills | Status: AC

## 2022-09-01 NOTE — Progress Notes (Signed)
Robin Ellison is a 47 y.o. female with the following history as recorded in EpicCare:  Patient Active Problem List   Diagnosis Date Noted   Hirsutism 08/31/2016   PCP NOTES >>> 06/03/2015   Hypertension 03/21/2013   Annual physical exam 01/06/2011   Allergic rhinitis 01/06/2011   OBESITY 04/08/2008   VSD (ventricular septal defect) 04/08/2008   FIBROIDS, UTERUS 10/18/2007   ANEMIA, IRON DEFICIENCY NOS 05/10/2007   LEARNING DISABILITY 05/10/2007    Current Outpatient Medications  Medication Sig Dispense Refill   Ascorbic Acid (VITAMIN C) 1000 MG tablet Take 1,000 mg by mouth daily.     Beta Carotene (VITAMIN A) 25000 UNIT capsule Take 10,000 Units by mouth daily.     calcium-vitamin D (OSCAL WITH D) 500-5 MG-MCG tablet Take 1 tablet by mouth.     Cinnamon 500 MG TABS Take by mouth.     Multiple Vitamins-Minerals (HAIR SKIN & NAILS ADVANCED) TABS Take by mouth.     vitamin B-12 (CYANOCOBALAMIN) 500 MCG tablet Take 500 mcg by mouth daily.     zinc gluconate 50 MG tablet Take 50 mg by mouth daily.     acetaminophen (TYLENOL 8 HOUR) 650 MG CR tablet Take 650 mg by mouth every 8 (eight) hours as needed for pain.     BIOTIN PO Take 10,000 mg by mouth daily.      cholecalciferol (VITAMIN D) 1000 units tablet Take 1,000 Units by mouth daily.     metoprolol tartrate (LOPRESSOR) 25 MG tablet Take 1 tablet (25 mg total) by mouth 2 (two) times daily. 60 tablet 3   VITAMIN E PO Take by mouth.     No current facility-administered medications for this visit.    Allergies: Seasonal ic [octacosanol]  Past Medical History:  Diagnosis Date   Anemia    iron def, used to see hematology for IV iron   Fibroids    s/p removal ~ 2010   Hypertension 03/21/2013   Learning disability    Seasonal allergies     Past Surgical History:  Procedure Laterality Date   MYOMECTOMY  2010    Family History  Problem Relation Age of Onset   Kidney disease Father        End sate   Coronary artery disease  Father    Diabetes Unknown        grandmother   Heart disease Paternal Aunt    Cancer Paternal Aunt    Diabetes Maternal Uncle    Neuropathy Mother    Colon cancer Neg Hx    Breast cancer Neg Hx     Social History   Tobacco Use   Smoking status: Never   Smokeless tobacco: Never  Substance Use Topics   Alcohol use: Yes    Comment: rare    Subjective:  Has been a previous patient at this practice- was a patient of Dr. Ethel Rana but had not seen him in over 3 years; history of hypertension- readily admits she does not take her medication as prescribed but would like updated refill today; overdue for preventive healthcare needs and ready to get back on track;   Objective:  Vitals:   09/01/22 1509  BP: (!) 140/78  Pulse: (!) 110  SpO2: 98%  Weight: 252 lb 9.6 oz (114.6 kg)  Height: '5\' 2"'$  (1.575 m)    General: Well developed, well nourished, in no acute distress  Skin : Warm and dry.  Head: Normocephalic and atraumatic  Eyes: Sclera and conjunctiva clear;  pupils round and reactive to light; extraocular movements intact  Lungs: Respirations unlabored; clear to auscultation bilaterally without wheeze, rales, rhonchi  CVS exam: normal rate and regular rhythm, murmur noted Neurologic: Alert and oriented; speech intact; face symmetrical; moves all extremities well; CNII-XII intact without focal deficit   Assessment:  1. Essential hypertension   2. VSD (ventricular septal defect)   3. Cervical cancer screening   4. Visit for screening mammogram   5. Encounter for screening colonoscopy   6. Lipid screening   7. Elevated glucose   8. Iron deficiency anemia, unspecified iron deficiency anemia type   9. BMI 45.0-49.9, adult [facility])     Plan:  History of non compliance- has not been seen since 2020 and last refill for blood pressure medication was updated in 2021; has not been under the care of any other providers; She will need to return for fasting labs so baseline can be  established; Refer to GYN and update screening mammogram; refer to GI for screening colonoscopy; Refer to cardiology due to history of VSD;  To consider referral back to hematology for management of anemia- has had iron infusions in the past;  Refer to [facility] to address obesity;   No follow-ups on file.  Orders Placed This Encounter  Procedures   MM Digital Screening    Standing Status:   Future    Standing Expiration Date:   09/02/2023    Order Specific Question:   Reason for Exam (SYMPTOM  OR DIAGNOSIS REQUIRED)    Answer:   screening mammogram    Order Specific Question:   Is the patient pregnant?    Answer:   No    Order Specific Question:   Preferred imaging location?    Answer:   [facility]   CBC with Differential/Platelet    Standing Status:   Future    Standing Expiration Date:   09/02/2023   Comp Met (CMET)    Standing Status:   Future    Standing Expiration Date:   09/02/2023   Lipid panel    Standing Status:   Future    Standing Expiration Date:   09/02/2023   Hemoglobin A1c    Standing Status:   Future    Standing Expiration Date:   09/02/2023   Iron, TIBC and Ferritin Panel    Standing Status:   Future    Standing Expiration Date:   09/02/2023   Ambulatory referral to Cardiology    Referral Priority:   Routine    Referral Type:   Consultation    Referral Reason:   Specialty Services Required    Requested Specialty:   Cardiology    Number of Visits Requested:   1   Ambulatory referral to Obstetrics / Gynecology    Referral Priority:   Routine    Referral Type:   Consultation    Referral Reason:   Specialty Services Required    Requested Specialty:   Obstetrics and Gynecology    Number of Visits Requested:   1   Ambulatory referral to Gastroenterology    Referral Priority:   Routine    Referral Type:   Consultation    Referral Reason:   Specialty Services Required    Number of Visits Requested:   1   Amb Ref to Medical Weight Management     Referral Priority:   Routine    Referral Type:   Consultation    Number of Visits Requested:   1  Requested Prescriptions   Signed Prescriptions Disp Refills   metoprolol tartrate (LOPRESSOR) 25 MG tablet 60 tablet 3    Sig: Take 1 tablet (25 mg total) by mouth 2 (two) times daily.

## 2022-09-03 ENCOUNTER — Encounter: Payer: Self-pay | Admitting: *Deleted

## 2022-09-03 DIAGNOSIS — R7309 Other abnormal glucose: Secondary | ICD-10-CM

## 2022-09-04 ENCOUNTER — Encounter (INDEPENDENT_AMBULATORY_CARE_PROVIDER_SITE_OTHER): Payer: Self-pay

## 2022-09-06 LAB — GLUCOSE, POCT (MANUAL RESULT ENTRY): POC Glucose: 84 mg/dl (ref 70–99)

## 2022-09-06 NOTE — Congregational Nurse Program (Signed)
  Dept: [phone]   Congregational Nurse Program Note  Date of Encounter: 09/03/2022  Past Medical History: Past Medical History:  Diagnosis Date   Anemia    iron def, used to see hematology for IV iron   Fibroids    s/p removal ~ 2010   Hypertension 03/21/2013   Learning disability    Seasonal allergies     Encounter Details:  CNP Questionnaire - 09/03/22 1230       Questionnaire   Ask client: Do you give verbal consent for me to treat you today? Yes    Student Assistance N/A    Location Patient [place]    Visit Setting with Client [employer]    Patient Status Unknown    Insurance Unknown    Insurance/Financial Assistance Referral N/A    Medication N/A    Medical Provider Yes    Screening Referrals Made N/A    Medical Referrals Made N/A    Medical Appointment Made N/A    Recently w/o PCP, now 1st time PCP visit completed due to CNs referral or appointment made N/A    Food N/A    Transportation N/A    Housing/Utilities N/A    Interpersonal Safety N/A    Interventions Advocate/Support;Educate    Abnormal to Normal Screening Since Last CN Visit N/A    Screenings CN Performed Blood Pressure;Blood Glucose    Sent Client to Lab for: N/A    Did client attend any of the following based off CNs referral or appointments made? N/A    ED Visit Averted Yes    Life-Saving Intervention Made N/A            Client came into [facility] clinic for BP and glucose check.  BP 140/90 and HR 95.  Glucose fingerstick results 84 mg.  Discussed findings with client.  Encouraged client to check in with her PCP and see MD for annual check up.  Also encouraged client to remember to take BP medication daily as prescribed.  Will follow up with client as desired.  Karene Fry, RN, MSN, [place] Office [phone] Cell

## 2022-09-11 ENCOUNTER — Other Ambulatory Visit: Payer: BC Managed Care – PPO

## 2022-09-11 ENCOUNTER — Encounter (INDEPENDENT_AMBULATORY_CARE_PROVIDER_SITE_OTHER): Payer: Self-pay

## 2022-10-08 ENCOUNTER — Encounter: Payer: Self-pay | Admitting: *Deleted

## 2022-10-08 DIAGNOSIS — R7309 Other abnormal glucose: Secondary | ICD-10-CM

## 2022-10-08 LAB — GLUCOSE, POCT (MANUAL RESULT ENTRY): POC Glucose: 112 mg/dl — AB (ref 70–99)

## 2022-10-08 NOTE — Congregational Nurse Program (Signed)
  Dept: [phone]   Congregational Nurse Program Note  Date of Encounter: 10/08/2022  Past Medical History: Past Medical History:  Diagnosis Date   Anemia    iron def, used to see hematology for IV iron   Fibroids    s/p removal ~ 2010   Hypertension 03/21/2013   Learning disability    Seasonal allergies     Encounter Details:  CNP Questionnaire - 10/08/22 1215       Questionnaire   Ask client: Do you give verbal consent for me to treat you today? Yes    Student Assistance N/A    Location Patient [place]    Visit Setting with Client [employer]    Patient Status Unknown    Insurance Unknown    Insurance/Financial Assistance Referral N/A    Medication N/A    Medical Provider Yes    Screening Referrals Made N/A    Medical Referrals Made N/A    Medical Appointment Made N/A    Recently w/o PCP, now 1st time PCP visit completed due to CNs referral or appointment made N/A    Food N/A    Transportation N/A    Housing/Utilities N/A    Interpersonal Safety N/A    Interventions Advocate/Support;Educate;Counsel    Abnormal to Normal Screening Since Last CN Visit N/A    Screenings CN Performed Blood Pressure;Blood Glucose    Sent Client to Lab for: N/A    Did client attend any of the following based off CNs referral or appointments made? N/A    ED Visit Averted Yes    Life-Saving Intervention Made N/A            Client came in after church service.  BP 160/104 HR 95 and fingerstick glucose 112 at 12:30 pm.  Client reports not taking her BP medication this am.  Educated client about pre diabetes.  Encouraged client to eat more fruits, veggies, lean meats and less sugar, desserts and sodas.  Client expressed understanding.  Client will follow up with this CN as desired.

## 2023-02-11 IMAGING — DX DG CHEST 1V PORT
1 series · 1 of 1 positions shown · non-contrast
Comparison: None.

CLINICAL DATA: Cough, [id number]

EXAM:
PORTABLE CHEST 1 VIEW

[chest ap]
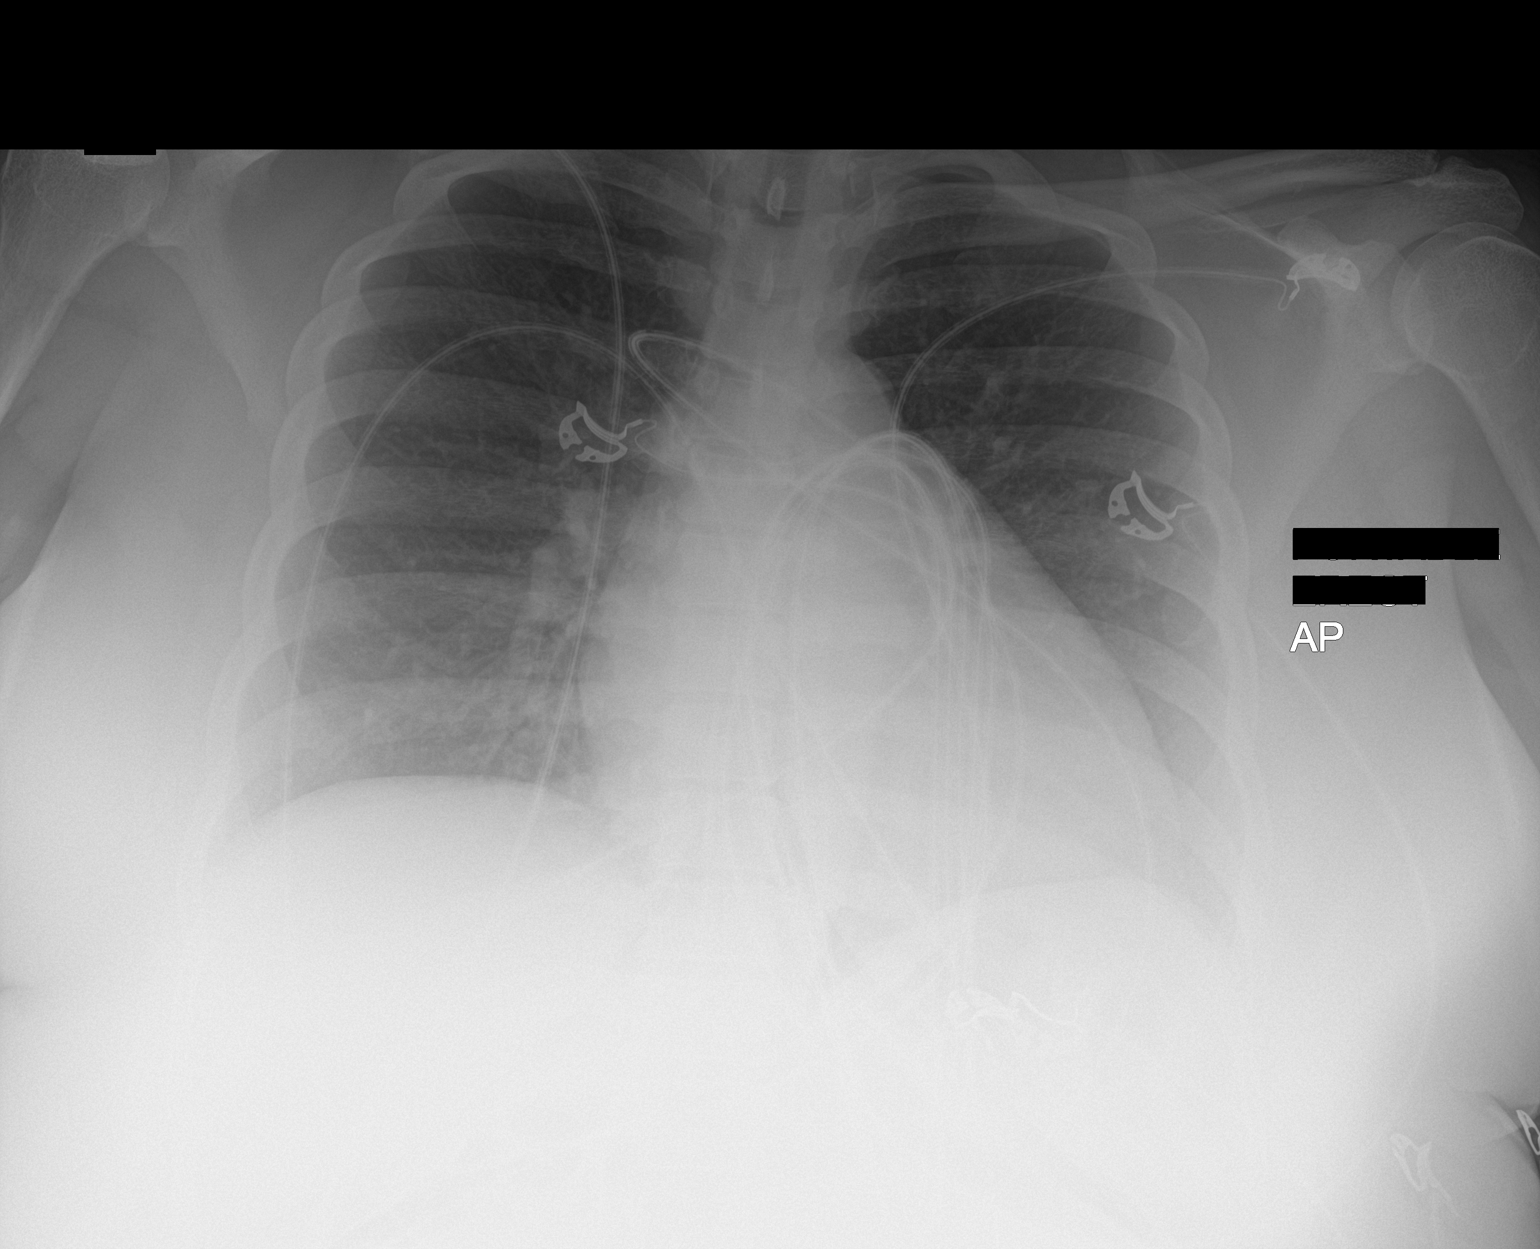

[1 of 1 positions shown; findings below may reference images not displayed]

FINDINGS: Increased attenuation towards the lung bases largely attributable to
body habitus. No focal consolidative process is seen. No
pneumothorax or visible layering pleural effusion. Pulmonary
vascularity is normally distributed. Cardiac silhouette is within
expected normals for the portable technique. No acute osseous or
soft tissue abnormality. Telemetry leads overlie the chest.
IMPRESSION: Accounting for body habitus, the lungs are clear.
